# Patient Record
Sex: Female | Born: 1975 | Hispanic: No | Marital: Married
Health system: Southern US, Community
[De-identification: ages and names within clinical notes are randomized; demographics above are authoritative.]

## PROBLEM LIST (undated history)

## (undated) DIAGNOSIS — E119 Type 2 diabetes mellitus without complications: Secondary | ICD-10-CM

## (undated) DIAGNOSIS — J45909 Unspecified asthma, uncomplicated: Secondary | ICD-10-CM

## (undated) HISTORY — PX: OTHER SURGICAL HISTORY: SHX169

## (undated) HISTORY — PX: ABDOMINAL HYSTERECTOMY: SHX81

---

## 2009-06-28 ENCOUNTER — Inpatient Hospital Stay (HOSPITAL_COMMUNITY): Admission: EM | Admit: 2009-06-28 | Discharge: 2009-07-01 | Payer: Self-pay | Admitting: Internal Medicine

## 2009-06-28 ENCOUNTER — Ambulatory Visit: Payer: Self-pay | Admitting: Diagnostic Radiology

## 2009-06-28 ENCOUNTER — Encounter: Payer: Self-pay | Admitting: Emergency Medicine

## 2009-06-29 ENCOUNTER — Encounter: Payer: Self-pay | Admitting: Physician Assistant

## 2009-06-30 ENCOUNTER — Encounter: Payer: Self-pay | Admitting: Physician Assistant

## 2009-06-30 LAB — CONVERTED CEMR LAB
HCT: 23.3 %
MCHC: 30.6 g/dL
MCV: 59.2 fL
Platelets: 242 10*3/uL
RDW: 20 %
WBC: 36.7 10*3/uL

## 2009-07-01 ENCOUNTER — Encounter: Payer: Self-pay | Admitting: Physician Assistant

## 2009-07-30 ENCOUNTER — Ambulatory Visit: Payer: Self-pay | Admitting: Physician Assistant

## 2009-07-30 DIAGNOSIS — E876 Hypokalemia: Secondary | ICD-10-CM | POA: Insufficient documentation

## 2009-07-30 DIAGNOSIS — D509 Iron deficiency anemia, unspecified: Secondary | ICD-10-CM | POA: Insufficient documentation

## 2009-07-30 DIAGNOSIS — R7309 Other abnormal glucose: Secondary | ICD-10-CM | POA: Insufficient documentation

## 2009-07-30 DIAGNOSIS — J45909 Unspecified asthma, uncomplicated: Secondary | ICD-10-CM | POA: Insufficient documentation

## 2009-08-03 ENCOUNTER — Ambulatory Visit: Payer: Self-pay | Admitting: Physician Assistant

## 2009-08-04 LAB — CONVERTED CEMR LAB
ALT: 14 units/L (ref 0–35)
AST: 12 units/L (ref 0–37)
Albumin: 4.9 g/dL (ref 3.5–5.2)
Chloride: 102 meq/L (ref 96–112)
Creatinine, Ser: 0.61 mg/dL (ref 0.40–1.20)
Eosinophils Absolute: 0 10*3/uL (ref 0.0–0.7)
Lymphocytes Relative: 22 % (ref 12–46)
Lymphs Abs: 2.9 10*3/uL (ref 0.7–4.0)
MCHC: 31.7 g/dL (ref 30.0–36.0)
Monocytes Absolute: 1.7 10*3/uL — ABNORMAL HIGH (ref 0.1–1.0)
Neutro Abs: 8.6 10*3/uL — ABNORMAL HIGH (ref 1.7–7.7)
Potassium: 4 meq/L (ref 3.5–5.3)
TSH: 1.001 microintl units/mL (ref 0.350–4.500)
Total Bilirubin: 0.4 mg/dL (ref 0.3–1.2)
Total Protein: 7.7 g/dL (ref 6.0–8.3)
WBC: 13.3 10*3/uL — ABNORMAL HIGH (ref 4.0–10.5)

## 2009-08-05 ENCOUNTER — Encounter: Payer: Self-pay | Admitting: Physician Assistant

## 2009-08-05 ENCOUNTER — Ambulatory Visit (HOSPITAL_COMMUNITY): Admission: RE | Admit: 2009-08-05 | Discharge: 2009-08-05 | Payer: Self-pay | Admitting: Physician Assistant

## 2009-08-17 ENCOUNTER — Ambulatory Visit: Payer: Self-pay | Admitting: Physician Assistant

## 2009-12-03 ENCOUNTER — Emergency Department (HOSPITAL_BASED_OUTPATIENT_CLINIC_OR_DEPARTMENT_OTHER): Admission: EM | Admit: 2009-12-03 | Discharge: 2009-12-03 | Payer: Self-pay | Admitting: Emergency Medicine

## 2009-12-03 ENCOUNTER — Ambulatory Visit: Payer: Self-pay | Admitting: Radiology

## 2010-09-07 NOTE — Assessment & Plan Note (Signed)
Summary: FOLLOW UP VISIT IN 2 WEEKS WITH Carla Li FOR ASTHMA//GK   Vital Signs:  Patient profile:   35 year old female Height:      66 inches Weight:      140 pounds BMI:     22.68 Temp:     97.7 degrees F Pulse rate:   76 / minute Pulse rhythm:   regular Resp:     18 per minute BP sitting:   122 / 82  (left arm) Cuff size:   regular  Vitals Entered By: [place] Shannon (August 17, 2009 4:19 PM) CC: f/u... Is Patient Diabetic? No Pain Assessment Patient in pain? no       Does patient need assistance? Functional Status Self care Ambulation Normal   CC:  f/u....  History of Present Illness: Here for f/u. Doing much better.  Breathing better.  Found mold in house and changed rooms.  Since has been much better.  Using proventil much less.  Used nebulizer once since last visit.  Asthma History    Asthma Control Assessment:    Age range: 12+ years    Symptoms: 0-2 days/week    Nighttime Awakenings: 0-2/month    Interferes w/ normal activity: no limitations    SABA use (not for EIB): 0-2 days/week    Asthma Control Assessment: Well Controlled   Current Medications (verified): 1)  Ferrous Sulfate 325 (65 Fe) Mg Tabs (Ferrous Sulfate) .... Take 1 Tablet By Mouth Two Times A Day 2)  Potassium Gluconate 595 Mg Tabs (Potassium Gluconate) .Marland Kitchen.. 1 By Mouth Two Times A Day (Over The Counter) 3)  Ventolin Hfa 108 (90 Base) Mcg/act Aers (Albuterol Sulfate) .Marland Kitchen.. 1-2 Puffs Every 4-6 Hours As Needed 4)  Nebulizer  Misc (Nebulizers) .... Brewing technologist (Dx:493.90) 5)  Advair Diskus 250-50 Mcg/dose Aepb (Fluticasone-Salmeterol) .Marland Kitchen.. 1 Puff Two Times A Day 6)  Advair Diskus 500-50 Mcg/dose Aepb (Fluticasone-Salmeterol) .Marland Kitchen.. 1 Puff Two Times A Day 7)  Prednisone 10 Mg Tabs (Prednisone) .... Take 6 Tabs Today, 5 On Fri, 4 On Sat, 3 On Sun, 2 On Mon and 1 On Tues and Stop 8)  Albuterol Sulfate (2.5 Mg/71ml) 0.083% Nebu (Albuterol Sulfate) .... Use in Nebulizer Every 6  Hours As Needed For Severe Shortness of Breath  Allergies (verified): 1)  ! Sulfa 2)  Codeine  Physical Exam  General:  alert and well-developed.   Head:  normocephalic and atraumatic.   Lungs:  normal breath sounds, no crackles, and no wheezes.   Heart:  normal rate and regular rhythm.   Neurologic:  alert & oriented X3 and cranial nerves II-XII intact.   Psych:  normally interactive.     Impression & Recommendations:  Problem # 1:  ASTHMA (ICD-493.90) much better controlled  The following medications were removed from the medication list:    Advair Diskus 500-50 Mcg/dose Aepb (Fluticasone-salmeterol) .Marland Kitchen... 1 puff two times a day    Prednisone 10 Mg Tabs (Prednisone) .Marland Kitchen... Take 6 tabs today, 5 on fri, 4 on sat, 3 on sun, 2 on mon and 1 on tues and stop Her updated medication list for this problem includes:    Ventolin Hfa 108 (90 Base) Mcg/act Aers (Albuterol sulfate) .Marland Kitchen... 1-2 puffs every 4-6 hours as needed    Advair Diskus 250-50 Mcg/dose Aepb (Fluticasone-salmeterol) .Marland Kitchen... 1 puff two times a day    Albuterol Sulfate (2.5 Mg/13ml) 0.083% Nebu (Albuterol sulfate) ..... Use in nebulizer every 6 hours as needed for severe shortness of breath  Complete Medication List: 1)  Ferrous Sulfate 325 (65 Fe) Mg Tabs (Ferrous sulfate) .... Take 1 tablet by mouth two times a day 2)  Potassium Gluconate 595 Mg Tabs (Potassium gluconate) .Marland Kitchen.. 1 by mouth two times a day (over the counter) 3)  Ventolin Hfa 108 (90 Base) Mcg/act Aers (Albuterol sulfate) .Marland Kitchen.. 1-2 puffs every 4-6 hours as needed 4)  Nebulizer Misc (Nebulizers) .... Dispense nebulizer machine and equipment (dx:493.90) 5)  Advair Diskus 250-50 Mcg/dose Aepb (Fluticasone-salmeterol) .Marland Kitchen.. 1 puff two times a day 6)  Albuterol Sulfate (2.5 Mg/13ml) 0.083% Nebu (Albuterol sulfate) .... Use in nebulizer every 6 hours as needed for severe shortness of breath  Patient Instructions: 1)  Follow up as previously  scheduled. Prescriptions: ADVAIR DISKUS 250-50 MCG/DOSE AEPB (FLUTICASONE-SALMETEROL) 1 puff two times a day  #2 x 0   Entered and Authorized by:   Tereso Newcomer PA-C   Signed by:   Tereso Newcomer PA-C on 08/17/2009   Method used:   Samples Given   RxID:   [id number]

## 2010-09-07 NOTE — Letter (Signed)
Summary: PT INFORMATION SHEET  PT INFORMATION SHEET   Imported By: Arta Bruce 09/02/2009 15:37:33  _____________________________________________________________________  External Attachment:    Type:   Image     Comment:   External Document

## 2010-10-26 LAB — URINALYSIS, ROUTINE W REFLEX MICROSCOPIC
Bilirubin Urine: NEGATIVE
Glucose, UA: NEGATIVE mg/dL
Hgb urine dipstick: NEGATIVE
Ketones, ur: 40 mg/dL — AB
Nitrite: NEGATIVE
Protein, ur: NEGATIVE mg/dL
Specific Gravity, Urine: >1.046 — ABNORMAL HIGH (ref 1.005–1.030)
Urobilinogen, UA: 0.2 mg/dL (ref 0.0–1.0)
pH: 5 (ref 5.0–8.0)

## 2010-10-26 LAB — DIFFERENTIAL
Basophils Absolute: 0 10*3/uL (ref 0.0–0.1)
Basophils Relative: 0 % (ref 0–1)
Eosinophils Relative: 0 % (ref 0–5)
Lymphocytes Relative: 8 % — ABNORMAL LOW (ref 12–46)
Lymphs Abs: 1.3 10*3/uL (ref 0.7–4.0)
Monocytes Relative: 5 % (ref 3–12)

## 2010-10-26 LAB — BASIC METABOLIC PANEL
Calcium: 9.5 mg/dL (ref 8.4–10.5)
Chloride: 106 mEq/L (ref 96–112)
Creatinine, Ser: 0.7 mg/dL (ref 0.4–1.2)
Glucose, Bld: 111 mg/dL — ABNORMAL HIGH (ref 70–99)
Potassium: 4.2 mEq/L (ref 3.5–5.1)

## 2010-10-26 LAB — CBC
HCT: 43.8 % (ref 36.0–46.0)
Hemoglobin: 14.4 g/dL (ref 12.0–15.0)
Platelets: 217 10*3/uL (ref 150–400)
WBC: 16 10*3/uL — ABNORMAL HIGH (ref 4.0–10.5)

## 2010-10-26 LAB — WET PREP, GENITAL: Yeast Wet Prep HPF POC: NONE SEEN

## 2010-11-10 LAB — CROSSMATCH
ABO/RH(D): O NEG
Antibody Screen: NEGATIVE

## 2010-11-10 LAB — COMPREHENSIVE METABOLIC PANEL
AST: 20 U/L (ref 0–37)
Alkaline Phosphatase: 91 U/L (ref 39–117)
Calcium: 8.9 mg/dL (ref 8.4–10.5)
Chloride: 102 mEq/L (ref 96–112)
GFR calc non Af Amer: >60 mL/min (ref >60)
Potassium: 2.7 mEq/L — CL (ref 3.5–5.1)
Sodium: 141 mEq/L (ref 135–145)

## 2010-11-10 LAB — CBC
HCT: 23.3 % — ABNORMAL LOW (ref 36.0–46.0)
Hemoglobin: 8.4 g/dL — ABNORMAL LOW (ref 12.0–15.0)
MCHC: 30.6 g/dL (ref 30.0–36.0)
MCHC: 30.8 g/dL (ref 30.0–36.0)
MCV: 59.2 fL — ABNORMAL LOW (ref 78.0–100.0)
Platelets: 242 10*3/uL (ref 150–400)
RDW: 18.6 % — ABNORMAL HIGH (ref 11.5–15.5)
WBC: 36.7 10*3/uL — ABNORMAL HIGH (ref 4.0–10.5)

## 2010-11-10 LAB — FOLATE: Folate: 9.2 ng/mL

## 2010-11-10 LAB — DIFFERENTIAL
Basophils Absolute: 0.2 10*3/uL — ABNORMAL HIGH (ref 0.0–0.1)
Eosinophils Relative: 1 % (ref 0–5)
Lymphocytes Relative: 7 % — ABNORMAL LOW (ref 12–46)
Monocytes Absolute: 1.1 10*3/uL — ABNORMAL HIGH (ref 0.1–1.0)
Monocytes Relative: 6 % (ref 3–12)
Neutrophils Relative %: 85 % — ABNORMAL HIGH (ref 43–77)

## 2010-11-10 LAB — URINALYSIS, MICROSCOPIC ONLY
Bilirubin Urine: NEGATIVE
Nitrite: NEGATIVE
pH: 5 (ref 5.0–8.0)

## 2010-11-10 LAB — VITAMIN B12: Vitamin B-12: 465 pg/mL (ref 211–911)

## 2010-11-10 LAB — RETICULOCYTES: Retic Ct Pct: 0.9 % (ref 0.4–3.1)

## 2010-11-10 LAB — ABO/RH: ABO/RH(D): O NEG

## 2010-11-10 LAB — IRON AND TIBC: UIBC: 376 ug/dL

## 2010-11-10 LAB — T4, FREE: Free T4: 0.98 ng/dL (ref 0.80–1.80)

## 2014-05-14 DIAGNOSIS — J4520 Mild intermittent asthma, uncomplicated: Secondary | ICD-10-CM | POA: Insufficient documentation

## 2015-01-29 DIAGNOSIS — J309 Allergic rhinitis, unspecified: Secondary | ICD-10-CM | POA: Insufficient documentation

## 2015-01-29 DIAGNOSIS — J45909 Unspecified asthma, uncomplicated: Secondary | ICD-10-CM | POA: Insufficient documentation

## 2015-01-29 DIAGNOSIS — H6090 Unspecified otitis externa, unspecified ear: Secondary | ICD-10-CM | POA: Insufficient documentation

## 2015-01-29 DIAGNOSIS — J3089 Other allergic rhinitis: Secondary | ICD-10-CM | POA: Insufficient documentation

## 2015-03-31 DIAGNOSIS — H6093 Unspecified otitis externa, bilateral: Secondary | ICD-10-CM

## 2015-05-22 ENCOUNTER — Ambulatory Visit: Payer: Self-pay | Admitting: Internal Medicine

## 2015-11-26 DIAGNOSIS — G2581 Restless legs syndrome: Secondary | ICD-10-CM | POA: Insufficient documentation

## 2016-02-04 ENCOUNTER — Other Ambulatory Visit: Payer: Self-pay | Admitting: Allergy

## 2016-02-04 MED ORDER — EPINEPHRINE 0.3 MG/0.3ML IJ SOAJ: 0.3000 mg | Freq: Once | INTRAMUSCULAR | Status: DC

## 2016-11-01 DIAGNOSIS — N800 Endometriosis of uterus: Secondary | ICD-10-CM | POA: Insufficient documentation

## 2016-11-01 DIAGNOSIS — N8003 Adenomyosis of the uterus: Secondary | ICD-10-CM | POA: Insufficient documentation

## 2018-02-21 DIAGNOSIS — H608X3 Other otitis externa, bilateral: Secondary | ICD-10-CM | POA: Insufficient documentation

## 2018-02-21 DIAGNOSIS — H6983 Other specified disorders of Eustachian tube, bilateral: Secondary | ICD-10-CM | POA: Insufficient documentation

## 2020-06-02 ENCOUNTER — Other Ambulatory Visit: Payer: Self-pay

## 2020-06-02 ENCOUNTER — Emergency Department (HOSPITAL_BASED_OUTPATIENT_CLINIC_OR_DEPARTMENT_OTHER): Payer: 59

## 2020-06-02 ENCOUNTER — Emergency Department (HOSPITAL_BASED_OUTPATIENT_CLINIC_OR_DEPARTMENT_OTHER)
Admission: EM | Admit: 2020-06-02 | Discharge: 2020-06-02 | Disposition: A | Discharge status: Home / Self Care | Payer: 59 | Attending: Emergency Medicine | Admitting: Emergency Medicine

## 2020-06-02 ENCOUNTER — Emergency Department (HOSPITAL_BASED_OUTPATIENT_CLINIC_OR_DEPARTMENT_OTHER): Admission: EM | Admit: 2020-06-02 | Discharge: 2020-06-02 | Discharge status: Absent without leave | Payer: Self-pay

## 2020-06-02 ENCOUNTER — Encounter (HOSPITAL_BASED_OUTPATIENT_CLINIC_OR_DEPARTMENT_OTHER): Payer: Self-pay

## 2020-06-02 DIAGNOSIS — J45909 Unspecified asthma, uncomplicated: Secondary | ICD-10-CM | POA: Diagnosis not present

## 2020-06-02 DIAGNOSIS — M546 Pain in thoracic spine: Secondary | ICD-10-CM | POA: Insufficient documentation

## 2020-06-02 DIAGNOSIS — M545 Low back pain, unspecified: Secondary | ICD-10-CM | POA: Insufficient documentation

## 2020-06-02 DIAGNOSIS — Y9269 Other specified industrial and construction area as the place of occurrence of the external cause: Secondary | ICD-10-CM | POA: Diagnosis not present

## 2020-06-02 DIAGNOSIS — Z87891 Personal history of nicotine dependence: Secondary | ICD-10-CM | POA: Diagnosis not present

## 2020-06-02 DIAGNOSIS — S39012A Strain of muscle, fascia and tendon of lower back, initial encounter: Secondary | ICD-10-CM

## 2020-06-02 DIAGNOSIS — M549 Dorsalgia, unspecified: Secondary | ICD-10-CM

## 2020-06-02 DIAGNOSIS — Z880 Allergy status to penicillin: Secondary | ICD-10-CM | POA: Diagnosis not present

## 2020-06-02 DIAGNOSIS — E119 Type 2 diabetes mellitus without complications: Secondary | ICD-10-CM | POA: Insufficient documentation

## 2020-06-02 DIAGNOSIS — X500XXA Overexertion from strenuous movement or load, initial encounter: Secondary | ICD-10-CM | POA: Diagnosis not present

## 2020-06-02 HISTORY — DX: Type 2 diabetes mellitus without complications: E11.9

## 2020-06-02 HISTORY — DX: Unspecified asthma, uncomplicated: J45.909

## 2020-06-02 LAB — URINALYSIS, ROUTINE W REFLEX MICROSCOPIC
Bilirubin Urine: NEGATIVE
Glucose, UA: NEGATIVE mg/dL
Hgb urine dipstick: NEGATIVE
Ketones, ur: NEGATIVE mg/dL
Leukocytes,Ua: NEGATIVE
Nitrite: NEGATIVE
Protein, ur: NEGATIVE mg/dL
Specific Gravity, Urine: >1.03 — ABNORMAL HIGH (ref 1.005–1.030)
pH: 5 (ref 5.0–8.0)

## 2020-06-02 LAB — COMPREHENSIVE METABOLIC PANEL
ALT: 17 U/L (ref 0–44)
AST: 22 U/L (ref 15–41)
Albumin: 4.6 g/dL (ref 3.5–5.0)
Alkaline Phosphatase: 52 U/L (ref 38–126)
Anion gap: 11 (ref 5–15)
BUN: 14 mg/dL (ref 6–20)
CO2: 23 mmol/L (ref 22–32)
Calcium: 8.7 mg/dL — ABNORMAL LOW (ref 8.9–10.3)
Chloride: 101 mmol/L (ref 98–111)
Creatinine, Ser: 0.6 mg/dL (ref 0.44–1.00)
GFR, Estimated: >60 mL/min (ref >60)
Glucose, Bld: 103 mg/dL — ABNORMAL HIGH (ref 70–99)
Potassium: 3.7 mmol/L (ref 3.5–5.1)
Sodium: 135 mmol/L (ref 135–145)
Total Bilirubin: 0.7 mg/dL (ref 0.3–1.2)
Total Protein: 8.1 g/dL (ref 6.5–8.1)

## 2020-06-02 LAB — CBC WITH DIFFERENTIAL/PLATELET
Abs Immature Granulocytes: 0.08 10*3/uL — ABNORMAL HIGH (ref 0.00–0.07)
Basophils Absolute: 0.1 10*3/uL (ref 0.0–0.1)
Basophils Relative: 1 %
Eosinophils Absolute: 0.3 10*3/uL (ref 0.0–0.5)
Eosinophils Relative: 2 %
HCT: 39 % (ref 36.0–46.0)
Hemoglobin: 12.6 g/dL (ref 12.0–15.0)
Immature Granulocytes: 1 %
Lymphocytes Relative: 9 %
Lymphs Abs: 1.5 10*3/uL (ref 0.7–4.0)
MCH: 27 pg (ref 26.0–34.0)
MCHC: 32.3 g/dL (ref 30.0–36.0)
MCV: 83.5 fL (ref 80.0–100.0)
Monocytes Absolute: 1.2 10*3/uL — ABNORMAL HIGH (ref 0.1–1.0)
Monocytes Relative: 7 %
Neutro Abs: 13.7 10*3/uL — ABNORMAL HIGH (ref 1.7–7.7)
Neutrophils Relative %: 80 %
Platelets: 247 10*3/uL (ref 150–400)
RBC: 4.67 MIL/uL (ref 3.87–5.11)
RDW: 13.9 % (ref 11.5–15.5)
WBC: 16.8 10*3/uL — ABNORMAL HIGH (ref 4.0–10.5)
nRBC: 0 % (ref 0.0–0.2)

## 2020-06-02 LAB — PREGNANCY, URINE: Preg Test, Ur: NEGATIVE

## 2020-06-02 MED ORDER — ONDANSETRON HCL 4 MG/2ML IJ SOLN
4.0000 mg | Freq: Once | INTRAMUSCULAR | Status: AC
  Administered 2020-06-02: 4 mg via INTRAVENOUS
  Filled 2020-06-02: qty 2

## 2020-06-02 MED ORDER — KETOROLAC TROMETHAMINE 30 MG/ML IJ SOLN
30.0000 mg | Freq: Once | INTRAMUSCULAR | Status: AC
  Administered 2020-06-02: 30 mg via INTRAVENOUS
  Filled 2020-06-02: qty 1

## 2020-06-02 MED ORDER — DIAZEPAM 5 MG PO TABS: 5.0000 mg | ORAL_TABLET | Freq: Four times a day (QID) | ORAL | 0 refills | Status: DC | PRN

## 2020-06-02 MED ORDER — SODIUM CHLORIDE 0.9 % IV BOLUS
1000.0000 mL | Freq: Once | INTRAVENOUS | Status: AC
  Administered 2020-06-02: 1000 mL via INTRAVENOUS

## 2020-06-02 MED ORDER — MORPHINE SULFATE (PF) 4 MG/ML IV SOLN
4.0000 mg | Freq: Once | INTRAVENOUS | Status: AC
  Administered 2020-06-02: 4 mg via INTRAVENOUS
  Filled 2020-06-02: qty 1

## 2020-06-02 MED ORDER — DIAZEPAM 5 MG/ML IJ SOLN
5.0000 mg | Freq: Once | INTRAMUSCULAR | Status: AC
Start: 2020-06-02 — End: 2020-06-02
  Administered 2020-06-02: 5 mg via INTRAVENOUS
  Filled 2020-06-02: qty 2

## 2020-06-02 MED ORDER — LIDOCAINE 5 % EX PTCH: 1.0000 | MEDICATED_PATCH | CUTANEOUS | 0 refills | Status: DC

## 2020-06-02 NOTE — ED Notes (Signed)
AVS and Rx reviewed with pt and husband. Discussed safety while utilizing valium for back pain. Opportunity for questions provided, copy of avs and work note given to husband.

## 2020-06-02 NOTE — ED Notes (Signed)
PT IS HIGH FALL RISK DUE TO MEDICATIONS REC

## 2020-06-02 NOTE — ED Notes (Signed)
ED MD at bedside to speak with pt and update on current status and plan of care

## 2020-06-02 NOTE — ED Provider Notes (Signed)
[facility] EMERGENCY DEPARTMENT Provider Note   CSN: [id number] Arrival date & time: 06/02/20  1600     History Chief Complaint  Patient presents with  . Back Pain    Carla Li is a 44 y.o. female hx of DM, asthma, here presenting with back pain.  Patient states that about 3 months ago she was picking up something heavy at work and she had upper mid back pain.  Patient states that it is a sense of spasms.  She states that her pain has been managed at home with some Tylenol and ibuprofen.  She states that this morning she took some Tylenol and this afternoon the pain suddenly got worse.  States that the pain is in the right flank area has no radiation to the pain.  Patient denies any additional trauma or injury.  Denies any trouble urinating or blood in her urine.  The history is provided by the patient.       Past Medical History:  Diagnosis Date  . Asthma   . Diabetes mellitus without complication [facility])     Patient Active Problem List   Diagnosis Date Noted  . HYPOKALEMIA 07/30/2009  . ANEMIA-IRON DEFICIENCY 07/30/2009  . ASTHMA 07/30/2009  . DIABETES MELLITUS, BORDERLINE 07/30/2009    Past Surgical History:  Procedure Laterality Date  . ABDOMINAL HYSTERECTOMY       OB History   No obstetric history on file.     No family history on file.  Social History   Tobacco Use  . Smoking status: Former Games developer  . Smokeless tobacco: Never Used  Vaping Use  . Vaping Use: Never used  Substance Use Topics  . Alcohol use: Never  . Drug use: Never    Home Medications Prior to Admission medications   Not on File    Allergies    Bactrim [sulfamethoxazole-trimethoprim], Codeine, Penicillins, Pineapple, and Sulfonamide derivatives  Review of Systems   Review of Systems  Musculoskeletal: Positive for back pain.  All other systems reviewed and are negative.   Physical Exam Updated Vital Signs BP 111/70 (BP Location: Left Arm)   Pulse 70   Temp 98  F (36.7 C) (Oral)   Resp 18   Ht 5\' 8"  (1.727 m)   Wt 71.2 kg   SpO2 100%   BMI 23.87 kg/m   Physical Exam Vitals and nursing note reviewed.  Constitutional:      Appearance: Normal appearance.  HENT:     Head: Normocephalic.     Nose: Nose normal.     Mouth/Throat:     Mouth: Mucous membranes are moist.  Eyes:     Extraocular Movements: Extraocular movements intact.     Pupils: Pupils are equal, round, and reactive to light.  Cardiovascular:     Rate and Rhythm: Normal rate and regular rhythm.     Pulses: Normal pulses.     Heart sounds: Normal heart sounds.  Pulmonary:     Effort: Pulmonary effort is normal.     Breath sounds: Normal breath sounds.  Abdominal:     General: Abdomen is flat.     Palpations: Abdomen is soft.  Musculoskeletal:     Cervical back: Normal range of motion.     Comments: Mild right para thoracic and paralumbar versus CVA tenderness.  No obvious deformity.  Skin:    General: Skin is warm.     Capillary Refill: Capillary refill takes less than 2 seconds.  Neurological:     General: No  focal deficit present.     Mental Status: She is alert and oriented to person, place, and time.     Comments: No saddle anesthesia and patient is neurovascular intact in lower extremities.  Psychiatric:        Mood and Affect: Mood normal.        Behavior: Behavior normal.     ED Results / Procedures / Treatments   Labs (all labs ordered are listed, but only abnormal results are displayed) Labs Reviewed  CBC WITH DIFFERENTIAL/PLATELET - Abnormal; Notable for the following components:      Result Value   WBC 16.8 (*)    Neutro Abs 13.7 (*)    Monocytes Absolute 1.2 (*)    Abs Immature Granulocytes 0.08 (*)    All other components within normal limits  COMPREHENSIVE METABOLIC PANEL - Abnormal; Notable for the following components:   Glucose, Bld 103 (*)    Calcium 8.7 (*)    All other components within normal limits  URINALYSIS, ROUTINE W REFLEX  MICROSCOPIC - Abnormal; Notable for the following components:   Specific Gravity, Urine >1.030 (*)    All other components within normal limits  PREGNANCY, URINE    EKG None  Radiology DG Thoracic Spine W/Swimmers  Result Date: 06/02/2020 CLINICAL DATA:  Back pain EXAM: THORACIC SPINE - 3 VIEWS COMPARISON:  None. FINDINGS: There is no evidence of thoracic spine fracture. Alignment is normal. No other significant bone abnormalities are identified. IMPRESSION: Negative. Electronically Signed   By: Jasmine Pang M.D.   On: 06/02/2020 17:50   DG Lumbar Spine Complete  Result Date: 06/02/2020 CLINICAL DATA:  Back pain EXAM: LUMBAR SPINE - COMPLETE 4+ VIEW COMPARISON:  None. FINDINGS: Mild dextrocurvature of the lumbar spine. Sagittal alignment within normal limits. Vertebral body heights and disc spaces are within normal limits. IMPRESSION: Mild dextrocurvature of the lumbar spine.  Otherwise negative. Electronically Signed   By: Jasmine Pang M.D.   On: 06/02/2020 17:51   DG Abdomen 1 View  Result Date: 06/02/2020 CLINICAL DATA:  Right flank pain EXAM: ABDOMEN - 1 VIEW COMPARISON:  CT 12/03/2009 FINDINGS: Faint calcifications in the right upper quadrant consistent with gallstones. Nonobstructed gas pattern with moderate stool. No radiopaque calculi over the kidneys. Probable phleboliths in the left pelvis. IMPRESSION: 1. Cholelithiasis. 2. Nonobstructed gas pattern with moderate stool. Electronically Signed   By: Jasmine Pang M.D.   On: 06/02/2020 17:52    Procedures Procedures (including critical care time)  Medications Ordered in ED Medications  sodium chloride 0.9 % bolus 1,000 mL (1,000 mLs Intravenous New Bag/Given 06/02/20 1742)  morphine 4 MG/ML injection 4 mg (4 mg Intravenous Given 06/02/20 1649)  ondansetron (ZOFRAN) injection 4 mg (4 mg Intravenous Given 06/02/20 1648)  ketorolac (TORADOL) 30 MG/ML injection 30 mg (30 mg Intravenous Given 06/02/20 1757)  diazepam (VALIUM)  injection 5 mg (5 mg Intravenous Given 06/02/20 1758)    ED Course  I have reviewed the triage vital signs and the nursing notes.  Pertinent labs & imaging results that were available during my care of the patient were reviewed by me and considered in my medical decision making (see chart for details).    MDM Rules/Calculators/A&P                          Daizee Firmin is a 44 y.o. female here presenting with right flank pain.  Patient likely has lumbar strain versus renal colic.  Plan  to get x-rays of the spine and KUB.  We will also get a UA to look for hematuria.  Patient has no neuro deficits to warrant an MRI.  Will give pain meds and reassess.  6:29 PM WBC 16. But UA showed no UTI and no fevers. xrays unremarkable. Pain improved with NSAIDs, pain meds. Likely muscle spasms. Will dc home with motrin, valium, lidocaine patch   Final Clinical Impression(s) / ED Diagnoses Final diagnoses:  Back pain    Rx / DC Orders ED Discharge Orders    None       Charlynne Pander, MD 06/02/20 [phone]

## 2020-06-02 NOTE — ED Notes (Signed)
PTambulated to BR. Right upper back pain. Denies n/t or urinary sx.

## 2020-06-02 NOTE — ED Triage Notes (Addendum)
Pt c/o mid/upper back pain started 130pm-pain worse with movement-reports injury to back ~3 months ago-slow gait-stood in triage

## 2020-06-02 NOTE — Discharge Instructions (Signed)
Your x-rays did not show any fracture.  You likely have muscle strain.  See your doctor for follow-up.  Rest for 2 days.  Continue taking ibuprofen.   You may add Valium for spasms.   You can also use lidocaine patch for comfort  See your doctor for follow-up  Return to ER if you have worse back pain, flank pain, weakness, numbness.

## 2020-06-02 NOTE — ED Notes (Signed)
Pt informed, along with husband, what medications are being given, pt instructed to not get up without staff in the room, sr x 2 up, husband at bedside, call bell within reach.

## 2020-07-13 ENCOUNTER — Ambulatory Visit: Payer: 59 | Admitting: Physician Assistant

## 2020-07-16 ENCOUNTER — Encounter: Payer: Self-pay | Admitting: Physician Assistant

## 2020-07-16 ENCOUNTER — Ambulatory Visit: Payer: 59 | Admitting: Physician Assistant

## 2020-07-16 ENCOUNTER — Other Ambulatory Visit: Payer: Self-pay | Admitting: Physician Assistant

## 2020-07-16 DIAGNOSIS — Z0289 Encounter for other administrative examinations: Secondary | ICD-10-CM

## 2020-07-16 DIAGNOSIS — H9193 Unspecified hearing loss, bilateral: Secondary | ICD-10-CM | POA: Insufficient documentation

## 2020-11-30 ENCOUNTER — Ambulatory Visit: Payer: Self-pay | Admitting: Family Medicine

## 2020-12-08 ENCOUNTER — Other Ambulatory Visit: Payer: Self-pay

## 2020-12-08 ENCOUNTER — Ambulatory Visit: Payer: 59 | Admitting: Family Medicine

## 2020-12-08 ENCOUNTER — Encounter: Payer: Self-pay | Admitting: Family Medicine

## 2020-12-08 VITALS — BP 126/85 | HR 89 | Temp 98.4°F | Ht 68.0 in | Wt 166.8 lb

## 2020-12-08 DIAGNOSIS — H608X3 Other otitis externa, bilateral: Secondary | ICD-10-CM | POA: Diagnosis not present

## 2020-12-08 DIAGNOSIS — G2581 Restless legs syndrome: Secondary | ICD-10-CM | POA: Diagnosis not present

## 2020-12-08 DIAGNOSIS — J4530 Mild persistent asthma, uncomplicated: Secondary | ICD-10-CM

## 2020-12-08 DIAGNOSIS — K581 Irritable bowel syndrome with constipation: Secondary | ICD-10-CM | POA: Diagnosis not present

## 2020-12-08 MED ORDER — FLUTICASONE FUROATE-VILANTEROL 200-25 MCG/INH IN AEPB: 1.0000 | INHALATION_SPRAY | Freq: Every day | RESPIRATORY_TRACT | 11 refills | Status: DC

## 2020-12-08 MED ORDER — MONTELUKAST SODIUM 10 MG PO TABS: 10.0000 mg | ORAL_TABLET | Freq: Every day | ORAL | 1 refills | Status: DC

## 2020-12-08 MED ORDER — ROPINIROLE HCL 2 MG PO TABS: 1.0000 mg | ORAL_TABLET | Freq: Every day | ORAL | 1 refills | Status: DC

## 2020-12-08 MED ORDER — TIZANIDINE HCL 6 MG PO CAPS: 6.0000 mg | ORAL_CAPSULE | Freq: Three times a day (TID) | ORAL | 1 refills | Status: DC | PRN

## 2020-12-08 MED ORDER — ALBUTEROL SULFATE HFA 108 (90 BASE) MCG/ACT IN AERS: 2.0000 | INHALATION_SPRAY | RESPIRATORY_TRACT | 11 refills | Status: DC | PRN

## 2020-12-08 MED ORDER — LINACLOTIDE 145 MCG PO CAPS: 145.0000 ug | ORAL_CAPSULE | Freq: Every day | ORAL | 5 refills | Status: DC

## 2020-12-08 MED ORDER — MELOXICAM 15 MG PO TABS: 15.0000 mg | ORAL_TABLET | Freq: Every day | ORAL | 5 refills | Status: DC

## 2020-12-08 NOTE — Progress Notes (Signed)
Subjective:  Patient ID: Carla Li, female    DOB: [birth year]  Age: 45 y.o. MRN: [id number]  CC: Establish Care    HPI Carla Li presents for nausea and irregular BMs. Sometimes 3-4 BM Carla day. Other times goes several days, 5-6 days. Then wil have explosive BM. Onset  100mo ago. Used to have it before her hyst.  In 2019. Worse now than ever.   Asthma has worse since she had COVID in January, 4 months ago.  She says she has Carla tightness in the upper chest that was not there before.  Of note is that she has not taken Breo in quite some time either.  She uses her albuterol as needed.  She also has Singulair to take daily.  She has Carla long history of allergy as well.  She uses ropinirole for restless leg 0.5 mg 2 at bedtime.  This is insufficient.  As Carla result she has used Valium in the past.  However she has not had any Valium in the last for 5 months.  Additionally she has used Valium as Carla muscle relaxer for her back.  She has chronic back repetitive call for restless leg 0.5 mg 2 at bedtime.  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  She had to bend over forward for several hours on her own.  As result she has had flares with several activities over time.  She says the Valium does work for her.  Depression screen PHQ 2/9 12/08/2020  Decreased Interest 0  Down, Depressed, Hopeless 0  PHQ - 2 Score 0    History ABrittleyhas Carla past medical history of Asthma and Diabetes mellitus without complication [place]   She has Carla past surgical history that includes Abdominal hysterectomy and tube placed and removed in ears.   Her family history includes Asthma in her sister; Celiac disease in her mother; Diabetes in her sister; Drug abuse in her brother; Eczema in her sister; Hearing loss in her sister; Heart murmur in her daughter; Hypotension in her mother; Seizures in her brother.She reports that she has quit smoking.  She has never used smokeless tobacco. She reports previous alcohol use. She reports that she does not use drugs.    ROS Review of Systems  Constitutional: Negative.   HENT: Negative for congestion.   Eyes: Negative for visual disturbance.  Respiratory: Negative for shortness of breath.   Cardiovascular: Negative for chest pain.  Gastrointestinal: Positive for abdominal pain and constipation. Negative for nausea and vomiting.  Genitourinary: Negative for difficulty urinating.  Musculoskeletal: Negative for arthralgias and myalgias.  Skin: Positive for rash (eczema cheeks and ears).  Neurological: Negative for headaches.  Psychiatric/Behavioral: Negative for sleep disturbance.    Objective:  BP 126/85   Pulse 89   Temp 98.4 F (36.9 C)   Ht 5' 8"  (1.727 m)   Wt 166 lb 12.8 oz (75.7 kg)   SpO2 98%   BMI 25.36 kg/m   BP Readings from Last 3 Encounters:  12/08/20 126/85  06/02/20 (!) 105/57  01/30/15 110/76    Wt Readings from Last 3 Encounters:  12/08/20 166 lb 12.8 oz (75.7 kg)  06/02/20 157 lb (71.2 kg)     Physical Exam Constitutional:      General: She is not in acute distress.    Appearance: She is well-developed.  HENT:  Head: Normocephalic and atraumatic.  Eyes:     Conjunctiva/sclera: Conjunctivae normal.     Pupils: Pupils are equal, round, and reactive to light.  Neck:     Thyroid: No thyromegaly.  Cardiovascular:     Rate and Rhythm: Normal rate and regular rhythm.     Heart sounds: Normal heart sounds. No murmur heard.   Pulmonary:     Effort: Pulmonary effort is normal. No respiratory distress.     Breath sounds: Normal breath sounds. No wheezing or rales.  Abdominal:     General: Bowel sounds are normal. There is no distension.     Palpations: Abdomen is soft.     Tenderness: There is no abdominal tenderness.  Musculoskeletal:        General: Normal range of motion.     Cervical back: Normal range of motion and neck supple.   Lymphadenopathy:     Cervical: No cervical adenopathy.  Skin:    General: Skin is warm and dry.  Neurological:     Mental Status: She is alert and oriented to person, place, and time.  Psychiatric:        Behavior: Behavior normal.        Thought Content: Thought content normal.        Judgment: Judgment normal.       Assessment & Plan:   Carla Li was seen today for establish care.  Diagnoses and all orders for this visit:  Mild persistent asthma without complication -     CBC with Differential/Platelet -     CMP14+EGFR  Chronic eczematous otitis externa of both ears -     CBC with Differential/Platelet -     CMP14+EGFR  Restless legs syndrome -     CBC with Differential/Platelet -     CMP14+EGFR  Irritable bowel syndrome with constipation -     CBC with Differential/Platelet -     CMP14+EGFR  Other orders -     tizanidine (ZANAFLEX) 6 MG capsule; Take 1 capsule (6 mg total) by mouth 3 (three) times daily as needed for muscle spasms. -     meloxicam (MOBIC) 15 MG tablet; Take 1 tablet (15 mg total) by mouth daily. For joint and muscle pain -     rOPINIRole (REQUIP) 2 MG tablet; Take 0.5 tablets (1 mg total) by mouth at bedtime. -     fluticasone furoate-vilanterol (BREO ELLIPTA) 200-25 MCG/INH AEPB; Inhale 1 puff into the lungs at bedtime. -     albuterol (VENTOLIN HFA) 108 (90 Base) MCG/ACT inhaler; Inhale 2 puffs into the lungs every 4 (four) hours as needed for wheezing or shortness of breath. TWO PUFFS EVERY 4-6 HOURS FOR COUGH AND WHEEZE -     linaclotide (LINZESS) 145 MCG CAPS capsule; Take 1 capsule (145 mcg total) by mouth daily. To regulate bowel movements -     montelukast (SINGULAIR) 10 MG tablet; Take 1 tablet (10 mg total) by mouth daily.       I have discontinued [name]'s diazepam, lidocaine, albuterol, loratadine, and norethindrone-ethinyl estradiol. I have also changed her rOPINIRole, albuterol, and montelukast. Additionally, I am having her  start on tizanidine, meloxicam, and linaclotide. Lastly, I am having her maintain her ELDERBERRY PO, Multiple Vitamins-Minerals (WOMENS MULTIVITAMIN PO), fluticasone furoate-vilanterol, and EPINEPHrine.  Allergies as of 12/08/2020      Reactions   Benadryl [diphenhydramine] Nausea Only   Sulfa Antibiotics Itching, Swelling, Anaphylaxis, Hives   Bactrim [sulfamethoxazole-trimethoprim]    Codeine    REACTION:  nausea   Codeine Nausea Only   Penicillins    Pineapple    Sulfonamide Derivatives    REACTION: hives, itching   Wound Dressing Adhesive Rash   Paper tape causes rash-clear tape okay      Medication List       Accurate as of Dec 08, 2020  9:55 PM. If you have any questions, ask your nurse or doctor.        STOP taking these medications   diazepam 5 MG tablet Commonly known as: Valium Stopped by: Claretta Fraise, MD   lidocaine 5 % Commonly known as: Lidoderm Stopped by: Claretta Fraise, MD   loratadine 10 MG tablet Commonly known as: CLARITIN Stopped by: Claretta Fraise, MD   norethindrone-ethinyl estradiol 1-20 MG-MCG tablet Commonly known as: LOESTRIN Stopped by: Claretta Fraise, MD     TAKE these medications   albuterol 108 (90 Base) MCG/ACT inhaler Commonly known as: VENTOLIN HFA Inhale 2 puffs into the lungs every 4 (four) hours as needed for wheezing or shortness of breath. TWO PUFFS EVERY 4-6 HOURS FOR COUGH AND WHEEZE What changed:   when to take this  reasons to take this  Another medication with the same name was removed. Continue taking this medication, and follow the directions you see here. Changed by: Claretta Fraise, MD   ELDERBERRY PO Take by mouth.   EPINEPHrine 0.3 mg/0.3 mL Soaj injection Commonly known as: EpiPen 2-Pak Inject 0.3 mLs (0.3 mg total) into the muscle once. FOR SEVERE ALLERGIC REACTIONS   fluticasone furoate-vilanterol 200-25 MCG/INH Aepb Commonly known as: BREO ELLIPTA Inhale 1 puff into the lungs at bedtime.   linaclotide 145  MCG Caps capsule Commonly known as: Linzess Take 1 capsule (145 mcg total) by mouth daily. To regulate bowel movements Started by: Claretta Fraise, MD   meloxicam 15 MG tablet Commonly known as: MOBIC Take 1 tablet (15 mg total) by mouth daily. For joint and muscle pain Started by: Claretta Fraise, MD   montelukast 10 MG tablet Commonly known as: SINGULAIR Take 1 tablet (10 mg total) by mouth daily.   rOPINIRole 2 MG tablet Commonly known as: REQUIP Take 0.5 tablets (1 mg total) by mouth at bedtime. What changed: medication strength Changed by: Claretta Fraise, MD   tizanidine 6 MG capsule Commonly known as: ZANAFLEX Take 1 capsule (6 mg total) by mouth 3 (three) times daily as needed for muscle spasms. Started by: Claretta Fraise, MD   WOMENS MULTIVITAMIN PO Take by mouth.        Follow-up: Return in about 6 weeks (around 01/19/2021).  Claretta Fraise, M.D.

## 2020-12-09 LAB — CBC WITH DIFFERENTIAL/PLATELET
Basophils Absolute: 0.1 10*3/uL (ref 0.0–0.2)
Basos: 1 %
EOS (ABSOLUTE): 0.2 10*3/uL (ref 0.0–0.4)
Eos: 3 %
Hematocrit: 36.1 % (ref 34.0–46.6)
Hemoglobin: 11.9 g/dL (ref 11.1–15.9)
Immature Grans (Abs): 0 10*3/uL (ref 0.0–0.1)
Immature Granulocytes: 0 %
Lymphocytes Absolute: 2.6 10*3/uL (ref 0.7–3.1)
Lymphs: 34 %
MCH: 26.2 pg — ABNORMAL LOW (ref 26.6–33.0)
MCHC: 33 g/dL (ref 31.5–35.7)
MCV: 80 fL (ref 79–97)
Monocytes Absolute: 1.2 10*3/uL — ABNORMAL HIGH (ref 0.1–0.9)
Monocytes: 15 %
Neutrophils Absolute: 3.7 10*3/uL (ref 1.4–7.0)
Neutrophils: 47 %
Platelets: 247 10*3/uL (ref 150–450)
RBC: 4.54 x10E6/uL (ref 3.77–5.28)
RDW: 14 % (ref 11.7–15.4)
WBC: 7.8 10*3/uL (ref 3.4–10.8)

## 2020-12-09 LAB — CMP14+EGFR
ALT: 16 IU/L (ref 0–32)
AST: 15 IU/L (ref 0–40)
Albumin/Globulin Ratio: 1.3 (ref 1.2–2.2)
Albumin: 4.3 g/dL (ref 3.8–4.8)
Alkaline Phosphatase: 76 IU/L (ref 44–121)
BUN/Creatinine Ratio: 10 (ref 9–23)
BUN: 10 mg/dL (ref 6–24)
Bilirubin Total: 0.3 mg/dL (ref 0.0–1.2)
CO2: 24 mmol/L (ref 20–29)
Calcium: 9.2 mg/dL (ref 8.7–10.2)
Chloride: 102 mmol/L (ref 96–106)
Creatinine, Ser: 1.05 mg/dL — ABNORMAL HIGH (ref 0.57–1.00)
Globulin, Total: 3.2 g/dL (ref 1.5–4.5)
Glucose: 104 mg/dL — ABNORMAL HIGH (ref 65–99)
Potassium: 4 mmol/L (ref 3.5–5.2)
Sodium: 140 mmol/L (ref 134–144)
Total Protein: 7.5 g/dL (ref 6.0–8.5)
eGFR: 68 mL/min/{1.73_m2} (ref >59)

## 2020-12-13 NOTE — Progress Notes (Signed)
Hello Glori,  Your lab result is normal and/or stable.Some minor variations that are not significant are commonly marked abnormal, but do not represent any medical problem for you.  Best regards, Pegeen Stiger, M.D.

## 2020-12-15 ENCOUNTER — Other Ambulatory Visit: Payer: Self-pay | Admitting: *Deleted

## 2020-12-15 MED ORDER — VENTOLIN HFA 108 (90 BASE) MCG/ACT IN AERS: 2.0000 | INHALATION_SPRAY | RESPIRATORY_TRACT | 6 refills | Status: DC | PRN

## 2021-01-20 ENCOUNTER — Ambulatory Visit: Payer: 59 | Admitting: Family Medicine

## 2021-01-20 ENCOUNTER — Other Ambulatory Visit: Payer: Self-pay

## 2021-01-20 VITALS — BP 122/77 | HR 88 | Temp 98.2°F | Ht 68.0 in | Wt 172.4 lb

## 2021-01-20 DIAGNOSIS — J301 Allergic rhinitis due to pollen: Secondary | ICD-10-CM

## 2021-01-20 DIAGNOSIS — J4520 Mild intermittent asthma, uncomplicated: Secondary | ICD-10-CM

## 2021-01-20 DIAGNOSIS — G2581 Restless legs syndrome: Secondary | ICD-10-CM | POA: Diagnosis not present

## 2021-01-20 MED ORDER — DICLOFENAC SODIUM 75 MG PO TBEC: DELAYED_RELEASE_TABLET | ORAL | 1 refills | Status: DC

## 2021-01-20 MED ORDER — CETIRIZINE HCL 10 MG PO TABS: 10.0000 mg | ORAL_TABLET | Freq: Every day | ORAL | 3 refills | Status: DC

## 2021-01-20 MED ORDER — CARBIDOPA-LEVODOPA 10-100 MG PO TABS: ORAL_TABLET | ORAL | 3 refills | Status: DC

## 2021-01-20 NOTE — Progress Notes (Signed)
Subjective:  Patient ID: Carla Li, female    DOB: 09-25-1975  Age: 45 y.o. MRN: [id number]  CC: Follow-up   HPI Carla Li presents for patient has allergic rhinitis symptoms including sneezing frequently sniffling, clear rhinorrhea, watery and itchy eyes. There has been no fever no chills no sweats. No earaches. There is some scratchy throat but no sore throat or difficulty swallowing. There is some nasal congestion. She was unable to tolerate the Zyrtec due to palpitations.  She is using Breo at night.  She has been using NSAIDs for pain on the bottom of her feet.  Meloxicam has not given her adequate relief.  The increase in the dose of ropinirole has helped with the restless legs.  There has not been complete relief. Depression screen [facility] 2/9 01/20/2021 12/08/2020  Decreased Interest 0 0  Down, Depressed, Hopeless 0 0  PHQ - 2 Score 0 0  Altered sleeping 3 -  Tired, decreased energy 3 -  Change in appetite 3 -  Feeling bad or failure about yourself  0 -  Trouble concentrating 2 -  Moving slowly or fidgety/restless 0 -  Suicidal thoughts 0 -  PHQ-9 Score 11 -    History Carla Li has a past medical history of Asthma and Diabetes mellitus without complication (HCC).   She has a past surgical history that includes Abdominal hysterectomy and tube placed and removed in ears.   Her family history includes Asthma in her sister; Celiac disease in her mother; Diabetes in her sister; Drug abuse in her brother; Eczema in her sister; Hearing loss in her sister; Heart murmur in her daughter; Hypotension in her mother; Seizures in her brother.She reports that she has quit smoking. She has never used smokeless tobacco. She reports previous alcohol use. She reports that she does not use drugs.    ROS Review of Systems  Constitutional: Negative.   HENT: Negative.    Eyes:  Negative for visual disturbance.  Respiratory:  Negative for shortness of breath.   Cardiovascular:  Negative for  chest pain.  Gastrointestinal:  Negative for abdominal pain.  Musculoskeletal:  Negative for arthralgias.   Objective:  BP 122/77   Pulse 88   Temp 98.2 F (36.8 C)   Ht 5\' 8"  (1.727 m)   Wt 172 lb 6.4 oz (78.2 kg)   SpO2 99%   BMI 26.21 kg/m   BP Readings from Last 3 Encounters:  01/20/21 122/77  12/08/20 126/85  06/02/20 (!) 105/57    Wt Readings from Last 3 Encounters:  01/20/21 172 lb 6.4 oz (78.2 kg)  12/08/20 166 lb 12.8 oz (75.7 kg)  06/02/20 157 lb (71.2 kg)     Physical Exam Constitutional:      General: She is not in acute distress.    Appearance: She is well-developed.  HENT:     Head: Normocephalic and atraumatic.  Eyes:     Conjunctiva/sclera: Conjunctivae normal.     Pupils: Pupils are equal, round, and reactive to light.  Neck:     Thyroid: No thyromegaly.  Cardiovascular:     Rate and Rhythm: Normal rate and regular rhythm.     Heart sounds: Normal heart sounds. No murmur heard. Pulmonary:     Effort: Pulmonary effort is normal. No respiratory distress.     Breath sounds: Normal breath sounds. No wheezing or rales.  Abdominal:     General: Bowel sounds are normal. There is no distension.     Palpations: Abdomen is soft.  Tenderness: There is no abdominal tenderness.  Musculoskeletal:        General: Normal range of motion.     Cervical back: Normal range of motion and neck supple.  Lymphadenopathy:     Cervical: No cervical adenopathy.  Skin:    General: Skin is warm and dry.  Neurological:     Mental Status: She is alert and oriented to person, place, and time.  Psychiatric:        Behavior: Behavior normal.        Thought Content: Thought content normal.        Judgment: Judgment normal.      Assessment & Plan:   Carla Li was seen today for follow-up.  Diagnoses and all orders for this visit:  Restless legs syndrome  Mild intermittent asthma without complication  Seasonal allergic rhinitis due to pollen  Other orders -      cetirizine (ZYRTEC) 10 MG tablet; Take 1 tablet (10 mg total) by mouth daily. For allergy symptoms -     diclofenac (VOLTAREN) 75 MG EC tablet; One each evening as needed for foot pain -     carbidopa-levodopa (SINEMET) 10-100 MG tablet; Take one daily after work for restless legs      I am having Carla Li start on cetirizine, diclofenac, and carbidopa-levodopa. I am also having her maintain her ELDERBERRY PO, Multiple Vitamins-Minerals (WOMENS MULTIVITAMIN PO), tizanidine, meloxicam, rOPINIRole, fluticasone furoate-vilanterol, albuterol, linaclotide, montelukast, Ventolin HFA, and EPINEPHrine.  Allergies as of 01/20/2021       Reactions   Benadryl [diphenhydramine] Nausea Only   Sulfa Antibiotics Itching, Swelling, Anaphylaxis, Hives   Bactrim [sulfamethoxazole-trimethoprim]    Codeine    REACTION: nausea   Codeine Nausea Only   Penicillins    Pineapple    Sulfonamide Derivatives    REACTION: hives, itching   Wound Dressing Adhesive Rash   Paper tape causes rash-clear tape okay        Medication List        Accurate as of January 20, 2021 11:59 PM. If you have any questions, ask your nurse or doctor.          albuterol 108 (90 Base) MCG/ACT inhaler Commonly known as: VENTOLIN HFA Inhale 2 puffs into the lungs every 4 (four) hours as needed for wheezing or shortness of breath. TWO PUFFS EVERY 4-6 HOURS FOR COUGH AND WHEEZE   Ventolin HFA 108 (90 Base) MCG/ACT inhaler Generic drug: albuterol Inhale 2 puffs into the lungs every 4 (four) hours as needed for wheezing or shortness of breath.   carbidopa-levodopa 10-100 MG tablet Commonly known as: Sinemet Take one daily after work for restless legs Started by: Mechele Claude, MD   cetirizine 10 MG tablet Commonly known as: ZYRTEC Take 1 tablet (10 mg total) by mouth daily. For allergy symptoms Started by: Mechele Claude, MD   diclofenac 75 MG EC tablet Commonly known as: VOLTAREN One each evening as needed for  foot pain Started by: Mechele Claude, MD   ELDERBERRY PO Take by mouth.   EPINEPHrine 0.3 mg/0.3 mL Soaj injection Commonly known as: EpiPen 2-Pak Inject 0.3 mLs (0.3 mg total) into the muscle once. FOR SEVERE ALLERGIC REACTIONS   fluticasone furoate-vilanterol 200-25 MCG/INH Aepb Commonly known as: BREO ELLIPTA Inhale 1 puff into the lungs at bedtime.   linaclotide 145 MCG Caps capsule Commonly known as: Linzess Take 1 capsule (145 mcg total) by mouth daily. To regulate bowel movements   meloxicam 15 MG tablet Commonly known  as: MOBIC Take 1 tablet (15 mg total) by mouth daily. For joint and muscle pain   montelukast 10 MG tablet Commonly known as: SINGULAIR Take 1 tablet (10 mg total) by mouth daily.   rOPINIRole 2 MG tablet Commonly known as: REQUIP Take 0.5 tablets (1 mg total) by mouth at bedtime.   tizanidine 6 MG capsule Commonly known as: ZANAFLEX Take 1 capsule (6 mg total) by mouth 3 (three) times daily as needed for muscle spasms.   WOMENS MULTIVITAMIN PO Take by mouth.         Follow-up: No follow-ups on file.  Mechele Claude, M.D.

## 2021-01-25 ENCOUNTER — Encounter: Payer: Self-pay | Admitting: Family Medicine

## 2021-04-22 ENCOUNTER — Ambulatory Visit: Payer: 59 | Admitting: Family Medicine

## 2021-05-08 ENCOUNTER — Encounter: Payer: Self-pay | Admitting: Family Medicine

## 2021-06-07 ENCOUNTER — Encounter: Payer: Self-pay | Admitting: Family Medicine

## 2021-06-07 ENCOUNTER — Other Ambulatory Visit: Payer: Self-pay

## 2021-06-07 ENCOUNTER — Ambulatory Visit: Payer: 59 | Admitting: Family Medicine

## 2021-06-07 VITALS — BP 122/74 | HR 90 | Temp 97.7°F | Wt 178.0 lb

## 2021-06-07 DIAGNOSIS — R6889 Other general symptoms and signs: Secondary | ICD-10-CM | POA: Diagnosis not present

## 2021-06-07 DIAGNOSIS — G2581 Restless legs syndrome: Secondary | ICD-10-CM

## 2021-06-07 DIAGNOSIS — J4520 Mild intermittent asthma, uncomplicated: Secondary | ICD-10-CM

## 2021-06-07 DIAGNOSIS — D508 Other iron deficiency anemias: Secondary | ICD-10-CM | POA: Diagnosis not present

## 2021-06-07 MED ORDER — LINACLOTIDE 290 MCG PO CAPS: 290.0000 ug | ORAL_CAPSULE | Freq: Every day | ORAL | 3 refills | Status: DC

## 2021-06-07 MED ORDER — CARBIDOPA-LEVODOPA 10-100 MG PO TABS: ORAL_TABLET | ORAL | 3 refills | Status: DC

## 2021-06-07 MED ORDER — ROPINIROLE HCL 2 MG PO TABS: 1.0000 mg | ORAL_TABLET | Freq: Every day | ORAL | 3 refills | Status: DC

## 2021-06-07 MED ORDER — MONTELUKAST SODIUM 10 MG PO TABS: 10.0000 mg | ORAL_TABLET | Freq: Every day | ORAL | 3 refills | Status: DC

## 2021-06-07 NOTE — Progress Notes (Signed)
Subjective:  Patient ID: Carla Li, female    DOB: Nov 14, 1975  Age: 45 y.o. MRN: [id number]  CC: Follow-up   HPI Carla Li presents for being forgetful. Husband says she is scatterbrained. Noted to be worsening for a few weeks. Eye sight has changed. Had Covid in January.   Linzess worked at first, but quit working so she went to [employer] 1/2 cap daily.  Some days feeling mildly short winded. Albuterol works. Breo caused tachycardia. Husband has been noticing she wheezes.  HEadache - sinus. Occurring 2-3/week. Gets sneezing and watery eyes.   Depression screen [facility] 2/9 06/07/2021 06/07/2021 01/20/2021  Decreased Interest 0 0 0  Down, Depressed, Hopeless 0 0 0  PHQ - 2 Score 0 0 0  Altered sleeping 0 - 3  Tired, decreased energy 3 - 3  Change in appetite 1 - 3  Feeling bad or failure about yourself  0 - 0  Trouble concentrating 0 - 2  Moving slowly or fidgety/restless 0 - 0  Suicidal thoughts 0 - 0  PHQ-9 Score 4 - 11  Difficult doing work/chores Somewhat difficult - -    History Carla Li has a past medical history of Asthma and Diabetes mellitus without complication ([place]).   She has a past surgical history that includes Abdominal hysterectomy and tube placed and removed in ears.   Her family history includes Asthma in her sister; Celiac disease in her mother; Diabetes in her sister; Drug abuse in her brother; Eczema in her sister; Hearing loss in her sister; Heart murmur in her daughter; Hypotension in her mother; Seizures in her brother.She reports that she has quit smoking. She has never used smokeless tobacco. She reports that she does not currently use alcohol. She reports that she does not use drugs.    ROS Review of Systems  Constitutional: Negative.   HENT:  Negative for congestion.   Eyes:  Negative for visual disturbance.  Respiratory:  Positive for shortness of breath and wheezing.   Cardiovascular:  Negative for chest pain.  Gastrointestinal:  Negative for  abdominal pain, constipation, diarrhea, nausea and vomiting.  Genitourinary:  Negative for difficulty urinating.  Musculoskeletal:  Negative for arthralgias and myalgias.  Neurological:  Positive for headaches.  Psychiatric/Behavioral:  Positive for decreased concentration and sleep disturbance.    Objective:  BP 122/74   Pulse 90   Temp 97.7 F (36.5 C)   Wt 178 lb (80.7 kg)   SpO2 100%   BMI 27.06 kg/m   BP Readings from Last 3 Encounters:  06/07/21 122/74  01/20/21 122/77  12/08/20 126/85    Wt Readings from Last 3 Encounters:  06/07/21 178 lb (80.7 kg)  01/20/21 172 lb 6.4 oz (78.2 kg)  12/08/20 166 lb 12.8 oz (75.7 kg)     Physical Exam Constitutional:      General: She is not in acute distress.    Appearance: She is well-developed.  HENT:     Head: Normocephalic and atraumatic.  Eyes:     Conjunctiva/sclera: Conjunctivae normal.     Pupils: Pupils are equal, round, and reactive to light.  Neck:     Thyroid: No thyromegaly.  Cardiovascular:     Rate and Rhythm: Normal rate and regular rhythm.     Heart sounds: Normal heart sounds. No murmur heard. Pulmonary:     Effort: Pulmonary effort is normal. No respiratory distress.     Breath sounds: Normal breath sounds. No wheezing or rales.  Musculoskeletal:  General: Normal range of motion.     Cervical back: Normal range of motion and neck supple.  Lymphadenopathy:     Cervical: No cervical adenopathy.  Skin:    General: Skin is warm and dry.  Neurological:     Mental Status: She is alert and oriented to person, place, and time.  Psychiatric:        Behavior: Behavior normal.        Thought Content: Thought content normal.        Judgment: Judgment normal.      Assessment & Plan:   Carla Li was seen today for follow-up.  Diagnoses and all orders for this visit:  Other iron deficiency anemia -     CBC with Differential/Platelet -     CMP14+EGFR  Restless legs syndrome -     CBC with  Differential/Platelet -     CMP14+EGFR -     Magnesium  Mild intermittent asthma without complication -     CBC with Differential/Platelet -     CMP14+EGFR -     Ambulatory referral to Allergy  Forgetfulness  Other orders -     linaclotide (LINZESS) 290 MCG CAPS capsule; Take 1 capsule (290 mcg total) by mouth daily. To regulate bowel movements -     montelukast (SINGULAIR) 10 MG tablet; Take 1 tablet (10 mg total) by mouth daily. -     rOPINIRole (REQUIP) 2 MG tablet; Take 0.5 tablets (1 mg total) by mouth at bedtime. -     carbidopa-levodopa (SINEMET) 10-100 MG tablet; Take one daily after work for restless legs, and another at bedtime      I have discontinued Carla Li's fluticasone furoate-vilanterol. I have also changed her linaclotide and carbidopa-levodopa. Additionally, I am having her maintain her ELDERBERRY PO, Multiple Vitamins-Minerals (WOMENS MULTIVITAMIN PO), tizanidine, albuterol, Ventolin HFA, cetirizine, diclofenac, montelukast, rOPINIRole, and EPINEPHrine.  Allergies as of 06/07/2021       Reactions   Benadryl [diphenhydramine] Nausea Only   Sulfa Antibiotics Itching, Swelling, Anaphylaxis, Hives   Bactrim [sulfamethoxazole-trimethoprim]    Codeine    REACTION: nausea   Codeine Nausea Only   Penicillins    Pineapple    Sulfonamide Derivatives    REACTION: hives, itching   Wound Dressing Adhesive Rash   Paper tape causes rash-clear tape okay        Medication List        Accurate as of June 07, 2021  8:41 PM. If you have any questions, ask your nurse or doctor.          STOP taking these medications    fluticasone furoate-vilanterol 200-25 MCG/INH Aepb Commonly known as: BREO ELLIPTA Stopped by: Claretta Fraise, MD       TAKE these medications    albuterol 108 (90 Base) MCG/ACT inhaler Commonly known as: VENTOLIN HFA Inhale 2 puffs into the lungs every 4 (four) hours as needed for wheezing or shortness of breath. TWO PUFFS EVERY  4-6 HOURS FOR COUGH AND WHEEZE   Ventolin HFA 108 (90 Base) MCG/ACT inhaler Generic drug: albuterol Inhale 2 puffs into the lungs every 4 (four) hours as needed for wheezing or shortness of breath.   carbidopa-levodopa 10-100 MG tablet Commonly known as: Sinemet Take one daily after work for restless legs, and another at bedtime What changed: additional instructions Changed by: Claretta Fraise, MD   cetirizine 10 MG tablet Commonly known as: ZYRTEC Take 1 tablet (10 mg total) by mouth daily. For allergy symptoms  diclofenac 75 MG EC tablet Commonly known as: VOLTAREN One each evening as needed for foot pain   ELDERBERRY PO Take by mouth.   EPINEPHrine 0.3 mg/0.3 mL Soaj injection Commonly known as: EpiPen 2-Pak Inject 0.3 mLs (0.3 mg total) into the muscle once. FOR SEVERE ALLERGIC REACTIONS   linaclotide 290 MCG Caps capsule Commonly known as: Linzess Take 1 capsule (290 mcg total) by mouth daily. To regulate bowel movements What changed:  medication strength how much to take Changed by: Claretta Fraise, MD   montelukast 10 MG tablet Commonly known as: SINGULAIR Take 1 tablet (10 mg total) by mouth daily.   rOPINIRole 2 MG tablet Commonly known as: REQUIP Take 0.5 tablets (1 mg total) by mouth at bedtime.   tizanidine 6 MG capsule Commonly known as: ZANAFLEX Take 1 capsule (6 mg total) by mouth 3 (three) times daily as needed for muscle spasms.   WOMENS MULTIVITAMIN PO Take by mouth.         Follow-up: Return in about 3 months (around 09/07/2021).  Claretta Fraise, M.D.

## 2021-06-08 ENCOUNTER — Encounter: Payer: Self-pay | Admitting: Family Medicine

## 2021-06-08 LAB — CMP14+EGFR
ALT: 14 IU/L (ref 0–32)
AST: 16 IU/L (ref 0–40)
Albumin/Globulin Ratio: 1.3 (ref 1.2–2.2)
Albumin: 4.4 g/dL (ref 3.8–4.8)
Alkaline Phosphatase: 80 IU/L (ref 44–121)
BUN/Creatinine Ratio: 16 (ref 9–23)
BUN: 11 mg/dL (ref 6–24)
Bilirubin Total: 0.2 mg/dL (ref 0.0–1.2)
CO2: 22 mmol/L (ref 20–29)
Calcium: 8.7 mg/dL (ref 8.7–10.2)
Chloride: 105 mmol/L (ref 96–106)
Creatinine, Ser: 0.69 mg/dL (ref 0.57–1.00)
Globulin, Total: 3.3 g/dL (ref 1.5–4.5)
Glucose: 120 mg/dL — ABNORMAL HIGH (ref 70–99)
Potassium: 4.2 mmol/L (ref 3.5–5.2)
Sodium: 140 mmol/L (ref 134–144)
Total Protein: 7.7 g/dL (ref 6.0–8.5)
eGFR: 110 mL/min/1.73

## 2021-06-08 LAB — CBC WITH DIFFERENTIAL/PLATELET
Basophils Absolute: 0.1 x10E3/uL (ref 0.0–0.2)
Basos: 2 %
EOS (ABSOLUTE): 0.4 x10E3/uL (ref 0.0–0.4)
Eos: 6 %
Hematocrit: 39.9 % (ref 34.0–46.6)
Hemoglobin: 12.9 g/dL (ref 11.1–15.9)
Immature Grans (Abs): 0 x10E3/uL (ref 0.0–0.1)
Immature Granulocytes: 0 %
Lymphocytes Absolute: 2.4 x10E3/uL (ref 0.7–3.1)
Lymphs: 33 %
MCH: 26 pg — ABNORMAL LOW (ref 26.6–33.0)
MCHC: 32.3 g/dL (ref 31.5–35.7)
MCV: 80 fL (ref 79–97)
Monocytes Absolute: 0.9 x10E3/uL (ref 0.1–0.9)
Monocytes: 12 %
Neutrophils Absolute: 3.5 x10E3/uL (ref 1.4–7.0)
Neutrophils: 47 %
Platelets: 277 x10E3/uL (ref 150–450)
RBC: 4.97 x10E6/uL (ref 3.77–5.28)
RDW: 13.3 % (ref 11.7–15.4)
WBC: 7.3 x10E3/uL (ref 3.4–10.8)

## 2021-06-08 LAB — MAGNESIUM: Magnesium: 2.2 mg/dL (ref 1.6–2.3)

## 2021-06-08 NOTE — Progress Notes (Signed)
Hello [place],  Your lab result is normal and/or stable.Some minor variations that are not significant are commonly marked abnormal, but do not represent any medical problem for you.  Best regards, Jettson Crable, M.D.

## 2021-07-07 NOTE — Progress Notes (Signed)
New Patient Note  RE: Carla Li MRN: [id number] DOB: 06/20/1976 Date of Office Visit: 07/08/2021  Consult requested by: Mechele Claude, MD Primary care provider: Mechele Claude, MD  Chief Complaint: Asthma (Takes singular, zyrtec, and uses a nasal spray. July of 2022 she had covid and since then all her medications stopped working and she has been having a lot of headaches. Since stopping her allergy medication for this visit has had a lot of coughing, wheezing, shortness of breath. ) and Other (Has spots behind her ears that randomly appear not sure the cause - uses an eczema cream not sure the name white tub with blue letters )  History of Present Illness: I had the pleasure of seeing Carla Li for initial evaluation at the [facility] on 07/08/2021. She is a 45 y.o. female, who is referred here by Mechele Claude, MD for the evaluation of allergic rhinitis and asthma.  Asthma: She reports symptoms of chest tightness, shortness of breath, coughing, wheezing for many years. Current medications include albuterol prn which help. She reports not using aerochamber with inhalers. She tried the following inhalers: Breo - caused palpitations, Advair. Main triggers are unknown but it got worse after she got her second Covid-19 infections in July 2022.  In the last month, frequency of symptoms: daily. Frequency of nocturnal symptoms: 0x/month. Frequency of SABA use: <1x/week. Interference with physical activity: no. Sleep is undisturbed. In the last 12 months, emergency room visits/urgent care visits/doctor office visits or hospitalizations due to respiratory issues: 2. In the last 12 months, oral steroids courses: no. Lifetime history of hospitalization for respiratory issues: many times as a child. Prior intubations: no. Asthma was diagnosed at age 40. History of pneumonia: yes. She was not evaluated by pulmonologist in the past. Smoking exposure: stopped smoking in March 2021. Up  to date with flu vaccine: no. Up to date with COVID-19 vaccine: no. Prior Covid-19 infection: January 2022 and July 2022. History of reflux: no. No recent CXR.  Rhinitis: She reports symptoms of nasal congestion, rhinorrhea, itchy eyes. Symptoms have been going on for 25+ years. The symptoms are present all year around. Other triggers include exposure to unknown. Anosmia: no. Headache: yes. She has used Claritin, Flonase, Singulair with minimal improvement in symptoms.  Zyrtec helps the most. Sinus infections: yes - 1. Previous work up includes: skin testing in the past showed multiple positives per patient report. She got a few allergy shots and not sure why it was stopped.  Previous ENT evaluation: not recently, no prior sinus surgery. Previous sinus imaging: no. History of nasal polyps: no. Last eye exam: 2 years ago.  06/07/2021 PCP visit: "Some days feeling mildly short winded. Albuterol works. Breo caused tachycardia. Husband has been noticing she wheezes.   Headache - sinus. Occurring 2-3/week. Gets sneezing and watery eyes."  Assessment and Plan: Ettel is a 45 y.o. female with: Asthma Diagnosed with asthma at age 23.  With frequent hospitalizations as a child and asthma became quiescent as a young adult.  She was doing well until she had her second COVID infection in July 2022.  Since then having daily symptoms however does not like to use albuterol due to its side effects - shaking and jitteriness.  Breo caused palpitations. No recent chest x-ray.  Not up-to-date with COVID-19 vaccines. Today's spirometry showed some restriction with 5% improvement in FEV1 post bronchodilator treatment.  Clinically feeling improved. Daily controller medication(s): Arnuity 1 puff daily and rinse mouth  after each use. Continue Singulair (montelukast)  daily at night. During upper respiratory infections/asthma flares:  INCREASE Arnuity 1 puff to TWICE a day for 1-2 weeks until your  breathing symptoms return to baseline.  Pretreat with levoalbuterol 2 puffs May use levoalbuterol rescue inhaler 2 puffs every 4 to 6 hours as needed for shortness of breath, chest tightness, coughing, and wheezing. Monitor frequency of use.  Get spirometry at next visit.  Other allergic rhinitis Perennial rhinoconjunctivitis symptoms for 25+ years.  Skin testing in the past showed multiple positives and was on allergy shots briefly.  Zyrtec and Singulair seems to help the most. Today's skin testing showed: Positive to grass, ragweed, weed pollen, trees, mold, dust mites, cat, dog, cockroach.  Borderline to feathers. Start environmental control measures as below. Continue Singulair (montelukast)  daily at night. Use over the counter antihistamines such as Zyrtec (cetirizine), Claritin (loratadine), Allegra (fexofenadine), or Xyzal (levocetirizine) daily as needed. May take twice a day during allergy flares. May switch antihistamines every few months.  Dermatitis Rash behind the ears where her hearing aids are. Concerning for contact dermatitis. Advised to see if there's covering for hearing aids that she can use to minimize contact with the skin.  Use desonide 0.05% ointment twice a day as needed for mild rash flares - okay to use on the face, neck, groin area. Do not use more than 1 week at a time.  Vaccine counseling Recommend Covid-19 vaccinations. Discussed risks and benefits. Patient is open to receiving the injections.   Return in about 2 months (around 09/08/2021).  Meds ordered this encounter  Medications   desonide (DESOWEN) 0.05 % ointment    Sig: Apply 1 application topically 2 (two) times daily as needed (mild rash flare). Okay to use on the face, neck, groin area. Do not use more than 1 week at a time.    Dispense:  60 g    Refill:  2   levalbuterol (XOPENEX HFA) 45 MCG/ACT inhaler    Sig: Inhale 2 puffs into the lungs every 4 (four) hours as needed for wheezing or  shortness of breath (coughing fits).    Dispense:  1 each    Refill:  2   Fluticasone Furoate (ARNUITY ELLIPTA) 100 MCG/ACT AEPB    Sig: Inhale 1 puff into the lungs daily. Rinse mouth after each use.    Dispense:  30 each    Refill:  5    Lab Orders  No laboratory test(s) ordered today    Other allergy screening: Food allergy:  contact rash with fresh citrus Medication allergy: yes Hymenoptera allergy:  Some chest tightness with certain insect bites - it's not a bee or an ant. Urticaria: no Eczema:yes  History of recurrent infections suggestive of immunodeficency:  frequent infections as a child but not as an adult  Diagnostics: Spirometry:  Tracings reviewed. Her effort: Good reproducible efforts. FVC: 2.67L FEV1: 2.12L, 66% predicted FEV1/FVC ratio: 79% Interpretation: Spirometry consistent with possible restrictive disease with 5% improvement in FEV1 post bronchodilator treatment. Clinically feeling improved.   Please see scanned spirometry results for details.  Skin Testing: Environmental allergy panel and select foods. Positive to grass, ragweed, weed pollen, trees, mold, dust mites, cat, dog, cockroach.  Borderline to feathers. Negative to common foods. Results discussed with patient/family.  Airborne Adult Perc - 07/08/21 1058     Time Antigen Placed 1055    Allergen Manufacturer Waynette Buttery    Location Back    Number of Test 59  Panel 1 Select    1. Control-Buffer 50% Glycerol Negative    2. Control-Histamine 1 mg/ml 2+    3. Albumin saline Negative    4. Bahia Negative    5. French [place] Territ[place] Negative    6. Johnson Negative    7. Kentucky Blue Negative    8. Meadow Fescue Negative    9. Perennial Rye Negative    10. Sweet Vernal Negative    11. Timothy Negative    12. Cocklebur Negative    13. Burweed Marshelder Negative    14. Ragweed, short 2+    15. Ragweed, Giant 2+    16. Plantain,  English Negative    17. Lamb's Quarters Negative    18. Sheep Sorrell  Negative    19. Rough Pigweed Negative    20. Marsh Elder, Rough Negative    21. Mugwort, Common Negative    22. Ash mix Negative    23. Birch mix Negative    24. Beech American Negative    25. Box, Elder Negative    26. Cedar, red Negative    27. Cottonwood, Guinea-Bissau Negative    28. Elm mix Negative    29. Hickory Negative    30. Maple mix Negative    31. Oak, Guinea-Bissau mix Negative    32. Pecan Pollen Negative    33. Pine mix Negative    34. Sycamore Eastern Negative    35. Walnut, Black Pollen Negative    36. Alternaria alternata Negative    37. Cladosporium Herbarum Negative    38. Aspergillus mix Negative    39. Penicillium mix Negative    40. Bipolaris sorokiniana (Helminthosporium) Negative    41. Drechslera spicifera (Curvularia) Negative    42. Mucor plumbeus Negative    43. Fusarium moniliforme Negative    44. Aureobasidium pullulans (pullulara) Negative    45. Rhizopus oryzae Negative    46. Botrytis cinera Negative    47. Epicoccum nigrum Negative    48. Phoma betae Negative    49. Candida Albicans Negative    50. Trichophyton mentagrophytes Negative    51. Mite, D Farinae  5,000 AU/ml 3+    52. Mite, D Pteronyssinus  5,000 AU/ml 3+    53. Cat Hair 10,000 BAU/ml 2+    54.  Dog Epithelia Negative    55. Mixed Feathers --   +/-   56. Horse Epithelia Negative    57. Cockroach, German Negative    58. Mouse Negative    59. Tobacco Leaf Negative             Food Perc - 07/08/21 1058       Test Information   Time Antigen Placed 1055    Allergen Manufacturer Waynette Buttery    Location Back    Number of allergen test 10    Food Select      Food   1. Peanut Negative    2. Soybean food Negative    3. Wheat, whole Negative    4. Sesame Negative    5. Milk, cow Negative    6. Egg White, chicken Negative    7. Casein Negative    8. Shellfish mix Negative    9. Fish mix Negative    10. Cashew Negative             Intradermal - 07/08/21 1114     Time  Antigen Placed 1115    Allergen Manufacturer Waynette Buttery    Location Arm    Number of  Test 12    Control Negative    French [place] Territ[place] 2+    Johnson 2+    7 Grass 2+    Weed mix 2+    Tree mix 2+    Mold 1 2+    Mold 2 2+    Mold 3 Negative    Mold 4 2+    Cat 2+    Cockroach 2+             Past Medical History: Patient Active Problem List   Diagnosis Date Noted   Dermatitis 07/08/2021   Vaccine counseling 07/08/2021   Deaf, bilateral 07/16/2020   Chronic eczematous otitis externa of both ears 02/21/2018   Eustachian tube dysfunction, bilateral 02/21/2018   Adenomyosis 11/01/2016   Restless legs syndrome 11/26/2015   Asthma 01/29/2015   Other allergic rhinitis 01/29/2015   Otitis externa 01/29/2015   HYPOKALEMIA 07/30/2009   ANEMIA-IRON DEFICIENCY 07/30/2009   DIABETES MELLITUS, BORDERLINE 07/30/2009   Past Medical History:  Diagnosis Date   Asthma    Diabetes mellitus without complication (HCC)    Past Surgical History: Past Surgical History:  Procedure Laterality Date   ABDOMINAL HYSTERECTOMY     tube placed and removed in ears     Medication List:  Current Outpatient Medications  Medication Sig Dispense Refill   carbidopa-levodopa (SINEMET) 10-100 MG tablet Take one daily after work for restless legs, and another at bedtime 90 tablet 3   cetirizine (ZYRTEC) 10 MG tablet Take 1 tablet (10 mg total) by mouth daily. For allergy symptoms 90 tablet 3   desonide (DESOWEN) 0.05 % ointment Apply 1 application topically 2 (two) times daily as needed (mild rash flare). Okay to use on the face, neck, groin area. Do not use more than 1 week at a time. 60 g 2   diclofenac (VOLTAREN) 75 MG EC tablet One each evening as needed for foot pain 90 tablet 1   ELDERBERRY PO Take by mouth.     EPINEPHrine (EPIPEN 2-PAK) 0.3 mg/0.3 mL IJ SOAJ injection Inject 0.3 mLs (0.3 mg total) into the muscle once. FOR SEVERE ALLERGIC REACTIONS 2 Device 0   Fluticasone Furoate (ARNUITY ELLIPTA) 100  MCG/ACT AEPB Inhale 1 puff into the lungs daily. Rinse mouth after each use. 30 each 5   levalbuterol (XOPENEX HFA) 45 MCG/ACT inhaler Inhale 2 puffs into the lungs every 4 (four) hours as needed for wheezing or shortness of breath (coughing fits). 1 each 2   linaclotide (LINZESS) 290 MCG CAPS capsule Take 1 capsule (290 mcg total) by mouth daily. To regulate bowel movements 90 capsule 3   montelukast (SINGULAIR) 10 MG tablet Take 1 tablet (10 mg total) by mouth daily. 90 tablet 3   Multiple Vitamins-Minerals (WOMENS MULTIVITAMIN PO) Take by mouth.     rOPINIRole (REQUIP) 2 MG tablet Take 0.5 tablets (1 mg total) by mouth at bedtime. 90 tablet 3   tizanidine (ZANAFLEX) 6 MG capsule Take 1 capsule (6 mg total) by mouth 3 (three) times daily as needed for muscle spasms. 90 capsule 1   No current facility-administered medications for this visit.   Allergies: Allergies  Allergen Reactions   Benadryl [Diphenhydramine] Nausea Only   Sulfa Antibiotics Itching, Swelling, Anaphylaxis and Hives   Bactrim [Sulfamethoxazole-Trimethoprim]    Codeine     REACTION: nausea   Codeine Nausea Only   Penicillins    Pineapple    Sulfonamide Derivatives     REACTION: hives, itching   Wound Dressing Adhesive Rash  Paper tape causes rash-clear tape okay   Social History: Social History   Socioeconomic History   Marital status: Married    Spouse name: Ray   Number of children: 2   Years of education: Not on file   Highest education level: Some college, no degree  Occupational History   Not on file  Tobacco Use   Smoking status: Former   Smokeless tobacco: Never  Building services engineer Use: Never used  Substance and Sexual Activity   Alcohol use: Not Currently   Drug use: Never   Sexual activity: Not Currently    Birth control/protection: Surgical  Other Topics Concern   Not on file  Social History Narrative   Not on file   Social Determinants of Health   Financial Resource Strain: Not on  file  Food Insecurity: Not on file  Transportation Needs: Not on file  Physical Activity: Not on file  Stress: Not on file  Social Connections: Not on file   Lives in a 1 year house. Smoking: quit in 2021 Occupation: Animator History: Water Damage/mildew in the house: no Engineer, civil (consulting) in the family room: no Carpet in the bedroom: yes Heating: heat pump Cooling: heat pump Pet: yes 2 dogs x 11 yrs  Family History: Family History  Problem Relation Age of Onset   Celiac disease Mother    Hypotension Mother    Asthma Sister    Diabetes Sister    Hearing loss Sister    Eczema Sister    Heart murmur Daughter    Drug abuse Brother    Seizures Brother    Review of Systems  Constitutional:  Negative for appetite change, chills, fever and unexpected weight change.  HENT:  Positive for congestion, rhinorrhea and sneezing.   Eyes:  Negative for itching.  Respiratory:  Positive for cough, chest tightness, shortness of breath and wheezing.   Cardiovascular:  Negative for chest pain.  Gastrointestinal:  Positive for constipation. Negative for abdominal pain.  Genitourinary:  Negative for difficulty urinating.  Skin:  Positive for rash.  Allergic/Immunologic: Positive for environmental allergies. Negative for food allergies.  Neurological:  Negative for headaches.   Objective: BP 120/72   Pulse 76   Temp 98.2 F (36.8 C)   Resp 20   Ht 5\' 7"  (1.702 m)   Wt 177 lb 6.4 oz (80.5 kg)   SpO2 98%   BMI 27.78 kg/m  Body mass index is 27.78 kg/m. Physical Exam Vitals and nursing note reviewed.  Constitutional:      Appearance: Normal appearance. She is well-developed.  HENT:     Head: Normocephalic and atraumatic.     Right Ear: Tympanic membrane and external ear normal.     Left Ear: Tympanic membrane and external ear normal.     Nose: Nose normal.     Mouth/Throat:     Mouth: Mucous membranes are moist.     Pharynx: Oropharynx is clear.  Eyes:      Conjunctiva/sclera: Conjunctivae normal.  Cardiovascular:     Rate and Rhythm: Normal rate and regular rhythm.     Heart sounds: Normal heart sounds. No murmur heard.   No friction rub. No gallop.  Pulmonary:     Effort: Pulmonary effort is normal.     Breath sounds: Normal breath sounds. No wheezing, rhonchi or rales.  Musculoskeletal:     Cervical back: Neck supple.  Skin:    General: Skin is warm.     Findings: Rash present.  Comments: Erythematous patch bilaterally behind the ears - patient wears hearing aides  Neurological:     Mental Status: She is alert and oriented to person, place, and time.  Psychiatric:        Behavior: Behavior normal.  The plan was reviewed with the patient/family, and all questions/concerned were addressed.  It was my pleasure to see Karelyn today and participate in her care. Please feel free to contact me with any questions or concerns.  Sincerely,  Wyline Mood, DO Allergy & Immunology  [facility] office: [phone] [facility] office: [phone]

## 2021-07-08 ENCOUNTER — Encounter: Payer: Self-pay | Admitting: Allergy

## 2021-07-08 ENCOUNTER — Ambulatory Visit: Payer: 59 | Admitting: Allergy

## 2021-07-08 ENCOUNTER — Other Ambulatory Visit: Payer: Self-pay

## 2021-07-08 ENCOUNTER — Telehealth: Payer: Self-pay

## 2021-07-08 VITALS — BP 120/72 | HR 76 | Temp 98.2°F | Resp 20 | Ht 67.0 in | Wt 177.4 lb

## 2021-07-08 DIAGNOSIS — Z7185 Encounter for immunization safety counseling: Secondary | ICD-10-CM | POA: Diagnosis not present

## 2021-07-08 DIAGNOSIS — L309 Dermatitis, unspecified: Secondary | ICD-10-CM | POA: Diagnosis not present

## 2021-07-08 DIAGNOSIS — J3089 Other allergic rhinitis: Secondary | ICD-10-CM | POA: Diagnosis not present

## 2021-07-08 DIAGNOSIS — J45909 Unspecified asthma, uncomplicated: Secondary | ICD-10-CM

## 2021-07-08 MED ORDER — DESONIDE 0.05 % EX OINT: 1.0000 | TOPICAL_OINTMENT | Freq: Two times a day (BID) | CUTANEOUS | 2 refills | Status: AC | PRN

## 2021-07-08 MED ORDER — LEVALBUTEROL TARTRATE 45 MCG/ACT IN AERO: 2.0000 | INHALATION_SPRAY | RESPIRATORY_TRACT | 2 refills | Status: AC | PRN

## 2021-07-08 MED ORDER — EPINEPHRINE 0.3 MG/0.3ML IJ SOAJ: 0.3000 mg | INTRAMUSCULAR | 1 refills | Status: AC | PRN

## 2021-07-08 MED ORDER — ARNUITY ELLIPTA 100 MCG/ACT IN AEPB: 1.0000 | INHALATION_SPRAY | Freq: Every day | RESPIRATORY_TRACT | 5 refills | Status: DC

## 2021-07-08 NOTE — Assessment & Plan Note (Addendum)
Diagnosed with asthma at age 45.  With frequent hospitalizations as a child and asthma became quiescent as a young adult.  She was doing well until she had her second COVID infection in July 2022.  Since then having daily symptoms however does not like to use albuterol due to its side effects - shaking and jitteriness.  Breo caused palpitations. No recent chest x-ray.  Not up-to-date with COVID-19 vaccines.  Today's spirometry showed some restriction with 5% improvement in FEV1 post bronchodilator treatment.  Clinically feeling improved. . Daily controller medication(s): Arnuity 1 puff daily and rinse mouth after each use. . Continue Singulair (montelukast) 10mg  daily at night. . During upper respiratory infections/asthma flares:  o INCREASE Arnuity 1 puff to TWICE a day for 1-2 weeks until your breathing symptoms return to baseline.  o Pretreat with levoalbuterol 2 puffs . May use levoalbuterol rescue inhaler 2 puffs every 4 to 6 hours as needed for shortness of breath, chest tightness, coughing, and wheezing. Monitor frequency of use.  . Get spirometry at next visit.

## 2021-07-08 NOTE — Telephone Encounter (Signed)
I sent in an epi pen for insect stings. Patient was stung by a bee in the past and experienced tightness in her chest. Her current epi pens have expired and she request for Dr. Selena Batten to send in an up to date epi pen.

## 2021-07-08 NOTE — Patient Instructions (Addendum)
Today's skin testing showed: Positive to grass, ragweed, weed pollen, trees, mold, dust mites, cat, dog, cockroach.  Borderline to feathers. Negative to common foods. Results given.  Asthma:  Daily controller medication(s): Arnuity 1 puff daily and rinse mouth after each use. During upper respiratory infections/asthma flares:  INCREASE Arnuity 1 puff to TWICE a day for 1-2 weeks until your breathing symptoms return to baseline.  Pretreat with levoalbuterol 2 puffs Sent in levoalbuterol (xopenex) as a rescue inhaler - this replaces albuterol and it should not make you shaky.  May use levoalbuterol rescue inhaler 2 puffs every 4 to 6 hours as needed for shortness of breath, chest tightness, coughing, and wheezing. Monitor frequency of use.  Asthma control goals:  Full participation in all desired activities (may need albuterol before activity) Albuterol use two times or less a week on average (not counting use with activity) Cough interfering with sleep two times or less a month Oral steroids no more than once a year No hospitalizations   Environmental allergies Start environmental control measures as below. Continue Singulair (montelukast) 10mg  daily at night. Use over the counter antihistamines such as Zyrtec (cetirizine), Claritin (loratadine), Allegra (fexofenadine), or Xyzal (levocetirizine) daily as needed. May take twice a day during allergy flares. May switch antihistamines every few months.  Rash behind ears Most likely contact irritation - not sure if you are sensitive to the hearing aids. Use desonide 0.05% ointment twice a day as needed for mild rash flares - okay to use on the face, neck, groin area. Do not use more than 1 week at a time.  Covid-19 vaccines:  Follow up in 2 months or sooner if needed.   Reducing Pollen Exposure Pollen seasons: trees (spring), grass (summer) and ragweed/weeds  (fall). Keep windows closed in your home and car to lower pollen exposure.  Install air conditioning in the bedroom and throughout the house if possible.  Avoid going out in dry windy days - especially early morning. Pollen counts are highest between 5 - 10 AM and on dry, hot and windy days.  Save outside activities for late afternoon or after a heavy rain, when pollen levels are lower.  Avoid mowing of grass if you have grass pollen allergy. Be aware that pollen can also be transported indoors on people and pets.  Dry your clothes in an automatic dryer rather than hanging them outside where they might collect pollen.  Rinse hair and eyes before bedtime. Mold Control Mold and fungi can grow on a variety of surfaces provided certain temperature and moisture conditions exist.  Outdoor molds grow on plants, decaying vegetation and soil. The major outdoor mold, Alternaria and Cladosporium, are found in very high numbers during hot and dry conditions. Generally, a late summer - fall peak is seen for common outdoor fungal spores. Rain will temporarily lower outdoor mold spore count, but counts rise rapidly when the rainy period ends. The most important indoor molds are Aspergillus and Penicillium. Dark, humid and poorly ventilated basements are ideal sites for mold growth. The next most common sites of mold growth are the bathroom and the kitchen. Outdoor (Seasonal) Mold Control Use air conditioning and keep windows closed. Avoid exposure to decaying vegetation. Avoid leaf raking. Avoid grain handling. Consider wearing a face mask if working in moldy areas.  Indoor (Perennial) Mold Control  Maintain humidity below 50%. Get rid of mold growth on hard surfaces with water, detergent and, if necessary, 5% bleach (do not mix with other cleaners).  Then dry the area completely. If mold covers an area more than 10 square feet, consider hiring an indoor environmental professional. For clothing, washing with  soap and water is best. If moldy items cannot be cleaned and dried, throw them away. Remove sources e.g. contaminated carpets. Repair and seal leaking roofs or pipes. Using dehumidifiers in damp basements may be helpful, but empty the water and clean units regularly to prevent mildew from forming. All rooms, especially basements, bathrooms and kitchens, require ventilation and cleaning to deter mold and mildew growth. Avoid carpeting on concrete or damp floors, and storing items in damp areas. Control of House Dust Mite Allergen Dust mite allergens are a common trigger of allergy and asthma symptoms. While they can be found throughout the house, these microscopic creatures thrive in warm, humid environments such as bedding, upholstered furniture and carpeting. Because so much time is spent in the bedroom, it is essential to reduce mite levels there.  Encase pillows, mattresses, and box springs in special allergen-proof fabric covers or airtight, zippered plastic covers.  Bedding should be washed weekly in hot water (130 F) and dried in a hot dryer. Allergen-proof covers are available for comforters and pillows that can't be regularly washed.  Wash the allergy-proof covers every few months. Minimize clutter in the bedroom. Keep pets out of the bedroom.  Keep humidity less than 50% by using a dehumidifier or air conditioning. You can buy a humidity measuring device called a hygrometer to monitor this.  If possible, replace carpets with hardwood, linoleum, or washable area rugs. If that's not possible, vacuum frequently with a vacuum that has a HEPA filter. Remove all upholstered furniture and non-washable window drapes from the bedroom. Remove all non-washable stuffed toys from the bedroom.  Wash stuffed toys weekly. Pet Allergen Avoidance: Contrary to popular opinion, there are no "hypoallergenic" breeds of dogs or cats. That is because people are not allergic to an animal's hair, but to an allergen  found in the animal's saliva, dander (dead skin flakes) or urine. Pet allergy symptoms typically occur within minutes. For some people, symptoms can build up and become most severe 8 to 12 hours after contact with the animal. People with severe allergies can experience reactions in public places if dander has been transported on the pet owners' clothing. Keeping an animal outdoors is only a partial solution, since homes with pets in the yard still have higher concentrations of animal allergens. Before getting a pet, ask your allergist to determine if you are allergic to animals. If your pet is already considered part of your family, try to minimize contact and keep the pet out of the bedroom and other rooms where you spend a great deal of time. As with dust mites, vacuum carpets often or replace carpet with a hardwood floor, tile or linoleum. High-efficiency particulate air (HEPA) cleaners can reduce allergen levels over time. While dander and saliva are the source of cat and dog allergens, urine is the source of allergens from rabbits, hamsters, mice and [place] pigs; so ask a non-allergic family member to clean the animal's cage. If you have a pet allergy, talk to your allergist about the potential for allergy immunotherapy (allergy shots). This strategy can often provide long-term relief. Cockroach Allergen Avoidance Cockroaches are often found in the homes of densely populated urban areas, schools or commercial buildings, but these creatures can lurk almost anywhere. This does not mean that you have a dirty house or living area. Block all areas where roaches can  enter the home. This includes crevices, wall cracks and windows.  Cockroaches need water to survive, so fix and seal all leaky faucets and pipes. Have an exterminator go through the house when your family and pets are gone to eliminate any remaining roaches. Keep food in lidded containers and put pet food dishes away after your pets are done  eating. Vacuum and sweep the floor after meals, and take out garbage and recyclables. Use lidded garbage containers in the kitchen. Wash dishes immediately after use and clean under stoves, refrigerators or toasters where crumbs can accumulate. Wipe off the stove and other kitchen surfaces and cupboards regularly.   Skin care recommendations  Bath time: Always use lukewarm water. AVOID very hot or cold water. Keep bathing time to 5-10 minutes. Do NOT use bubble bath. Use a mild soap and use just enough to wash the dirty areas. Do NOT scrub skin vigorously.  After bathing, pat dry your skin with a towel. Do NOT rub or scrub the skin.  Moisturizers and prescriptions:  ALWAYS apply moisturizers immediately after bathing (within 3 minutes). This helps to lock-in moisture. Use the moisturizer several times a day over the whole body. Good summer moisturizers include: Aveeno, CeraVe, Cetaphil. Good winter moisturizers include: Aquaphor, Vaseline, Cerave, Cetaphil, Eucerin, Vanicream. When using moisturizers along with medications, the moisturizer should be applied about one hour after applying the medication to prevent diluting effect of the medication or moisturize around where you applied the medications. When not using medications, the moisturizer can be continued twice daily as maintenance.  Laundry and clothing: Avoid laundry products with added color or perfumes. Use unscented hypo-allergenic laundry products such as Tide free, Cheer free & gentle, and All free and clear.  If the skin still seems dry or sensitive, you can try double-rinsing the clothes. Avoid tight or scratchy clothing such as wool. Do not use fabric softeners or dyer sheets.

## 2021-07-08 NOTE — Assessment & Plan Note (Signed)
Perennial rhinoconjunctivitis symptoms for 25+ years.  Skin testing in the past showed multiple positives and was on allergy shots briefly.  Zyrtec and Singulair seems to help the most.  Today's skin testing showed: Positive to grass, ragweed, weed pollen, trees, mold, dust mites, cat, dog, cockroach.  Borderline to feathers.  Start environmental control measures as below.  Continue Singulair (montelukast) 10mg  daily at night.  Use over the counter antihistamines such as Zyrtec (cetirizine), Claritin (loratadine), Allegra (fexofenadine), or Xyzal (levocetirizine) daily as needed. May take twice a day during allergy flares. May switch antihistamines every few months.

## 2021-07-08 NOTE — Assessment & Plan Note (Signed)
Rash behind the ears where her hearing aids are.  Concerning for contact dermatitis.  Advised to see if there's covering for hearing aids that she can use to minimize contact with the skin.  . Use desonide 0.05% ointment twice a day as needed for mild rash flares - okay to use on the face, neck, groin area. Do not use more than 1 week at a time.

## 2021-07-08 NOTE — Addendum Note (Signed)
Addended by: Orson Aloe on: 07/08/2021 04:45 PM   Modules accepted: Orders

## 2021-07-08 NOTE — Assessment & Plan Note (Addendum)
   Recommend Covid-19 vaccinations.  Discussed risks and benefits. Patient is open to receiving the injections.

## 2021-07-08 NOTE — Telephone Encounter (Signed)
Noted  

## 2021-08-02 ENCOUNTER — Encounter: Payer: Self-pay | Admitting: Family Medicine

## 2021-08-07 ENCOUNTER — Encounter: Payer: Self-pay | Admitting: Family Medicine

## 2021-08-09 ENCOUNTER — Other Ambulatory Visit: Payer: Self-pay | Admitting: Allergy

## 2021-08-10 ENCOUNTER — Telehealth: Payer: Self-pay

## 2021-08-10 MED ORDER — AEROCHAMBER PLUS MISC: 2 refills | Status: DC

## 2021-08-10 NOTE — Telephone Encounter (Signed)
Tried calling pt to explain dr Selena Batten was changing her arnuity once a day inhale to flovent due to insurance coverage. Pts phone just kept ringing no voicemail

## 2021-08-10 NOTE — Telephone Encounter (Signed)
Tried calling pt to explain change but phone line just kept ringing

## 2021-08-10 NOTE — Telephone Encounter (Signed)
Please advise to change from arnuity to flovent

## 2021-08-10 NOTE — Telephone Encounter (Signed)
Please call patient and tell her that her insurance won't cover the Arnuity once a day inhaler. Sent in Flovent 2 puffs BID with spacer and rinse mouth afterwards.

## 2021-08-11 MED ORDER — MONTELUKAST SODIUM 10 MG PO TABS: 10.0000 mg | ORAL_TABLET | Freq: Every day | ORAL | 0 refills | Status: DC

## 2021-09-09 ENCOUNTER — Ambulatory Visit: Payer: 59 | Admitting: Allergy

## 2021-09-09 DIAGNOSIS — J309 Allergic rhinitis, unspecified: Secondary | ICD-10-CM

## 2021-09-09 NOTE — Progress Notes (Deleted)
Follow Up Note  RE: Carla Li MRN: [id number] DOB: Aug 04, 1976 Date of Office Visit: 09/09/2021  Referring provider: Claretta Fraise, MD Primary care provider: Claretta Fraise, MD  Chief Complaint: No chief complaint on file.  History of Present Illness: I had the pleasure of seeing Carla Li for a follow up visit at the [facility] on 09/09/2021. She is a 46 y.o. female, who is being followed for asthma, allergic rhinoconjunctivitis, dermatitis. Her previous allergy office visit was on 07/08/2021 with Dr. Maudie Mercury. Today is a regular follow up visit.  Asthma Diagnosed with asthma at age 61.  With frequent hospitalizations as a child and asthma became quiescent as a young adult.  She was doing well until she had her second COVID infection in July 2022.  Since then having daily symptoms however does not like to use albuterol due to its side effects - shaking and jitteriness.  Breo caused palpitations. No recent chest x-ray.  Not up-to-date with COVID-19 vaccines. Today's spirometry showed some restriction with 5% improvement in FEV1 post bronchodilator treatment.  Clinically feeling improved. Daily controller medication(s): Arnuity 161mcg 1 puff daily and rinse mouth after each use. Continue Singulair (montelukast) 10mg  daily at night. During upper respiratory infections/asthma flares:  INCREASE Arnuity 144mcg 1 puff to TWICE a day for 1-2 weeks until your breathing symptoms return to baseline.  Pretreat with levoalbuterol 2 puffs May use levoalbuterol rescue inhaler 2 puffs every 4 to 6 hours as needed for shortness of breath, chest tightness, coughing, and wheezing. Monitor frequency of use.  Get spirometry at next visit.   Other allergic rhinitis Perennial rhinoconjunctivitis symptoms for 25+ years.  Skin testing in the past showed multiple positives and was on allergy shots briefly.  Zyrtec and Singulair seems to help the most. Today's skin testing showed: Positive to  grass, ragweed, weed pollen, trees, mold, dust mites, cat, dog, cockroach.  Borderline to feathers. Start environmental control measures as below. Continue Singulair (montelukast) 10mg  daily at night. Use over the counter antihistamines such as Zyrtec (cetirizine), Claritin (loratadine), Allegra (fexofenadine), or Xyzal (levocetirizine) daily as needed. May take twice a day during allergy flares. May switch antihistamines every few months.   Dermatitis Rash behind the ears where her hearing aids are. Concerning for contact dermatitis. Advised to see if there's covering for hearing aids that she can use to minimize contact with the skin.  Use desonide 0.05% ointment twice a day as needed for mild rash flares - okay to use on the face, neck, groin area. Do not use more than 1 week at a time.   Vaccine counseling Recommend Covid-19 vaccinations. Discussed risks and benefits. Patient is open to receiving the injections.    Return in about 2 months (around 09/08/2021).  I sent in an epi pen for insect stings. Patient was stung by a bee in the past and experienced tightness in her chest. Her current epi pens have expired and she request for Dr. Maudie Mercury to send in an up to date epi pen.   Tried calling pt to explain dr Maudie Mercury was changing her arnuity once a day inhale to flovent due to insurance coverage. Pts phone just kept ringing no voicemail     Assessment and Plan: Carla Li is a 46 y.o. female with: No problem-specific Assessment & Plan notes found for this encounter.  No follow-ups on file.  No orders of the defined types were placed in this encounter.  Lab Orders  No laboratory test(s) ordered today  Diagnostics: Spirometry:  Tracings reviewed. Her effort: {Blank single:19197::"Good reproducible efforts.","It was hard to get consistent efforts and there is a question as to whether this reflects a maximal maneuver.","Poor effort, data can not be interpreted."} FVC: ***L FEV1: ***L, ***%  predicted FEV1/FVC ratio: ***% Interpretation: {Blank single:19197::"Spirometry consistent with mild obstructive disease","Spirometry consistent with moderate obstructive disease","Spirometry consistent with severe obstructive disease","Spirometry consistent with possible restrictive disease","Spirometry consistent with mixed obstructive and restrictive disease","Spirometry uninterpretable due to technique","Spirometry consistent with normal pattern","No overt abnormalities noted given today's efforts"}.  Please see scanned spirometry results for details.  Skin Testing: {Blank single:19197::"Select foods","Environmental allergy panel","Environmental allergy panel and select foods","Food allergy panel","None","Deferred due to recent antihistamines use"}. *** Results discussed with patient/family.   Medication List:  Current Outpatient Medications  Medication Sig Dispense Refill   carbidopa-levodopa (SINEMET) 10-100 MG tablet Take one daily after work for restless legs, and another at bedtime 90 tablet 3   cetirizine (ZYRTEC) 10 MG tablet Take 1 tablet (10 mg total) by mouth daily. For allergy symptoms 90 tablet 3   desonide (DESOWEN) 0.05 % ointment Apply 1 application topically 2 (two) times daily as needed (mild rash flare). Okay to use on the face, neck, groin area. Do not use more than 1 week at a time. 60 g 2   diclofenac (VOLTAREN) 75 MG EC tablet One each evening as needed for foot pain 90 tablet 1   ELDERBERRY PO Take by mouth.     EPINEPHrine (EPIPEN 2-PAK) 0.3 mg/0.3 mL IJ SOAJ injection Inject 0.3 mg into the muscle as needed for anaphylaxis. FOR SEVERE ALLERGIC REACTIONS 2 each 1   fluticasone (FLOVENT HFA) 110 MCG/ACT inhaler Inhale 2 puffs into the lungs in the morning and at bedtime. with spacer and rinse mouth afterwards. 1 each 3   levalbuterol (XOPENEX HFA) 45 MCG/ACT inhaler Inhale 2 puffs into the lungs every 4 (four) hours as needed for wheezing or shortness of breath  (coughing fits). 1 each 2   linaclotide (LINZESS) 290 MCG CAPS capsule Take 1 capsule (290 mcg total) by mouth daily. To regulate bowel movements 90 capsule 3   montelukast (SINGULAIR) 10 MG tablet Take 1 tablet (10 mg total) by mouth daily. 90 tablet 0   Multiple Vitamins-Minerals (WOMENS MULTIVITAMIN PO) Take by mouth.     rOPINIRole (REQUIP) 2 MG tablet Take 0.5 tablets (1 mg total) by mouth at bedtime. 90 tablet 3   Spacer/Aero-Holding Chambers (AEROCHAMBER PLUS) inhaler Use as instructed 1 each 2   tizanidine (ZANAFLEX) 6 MG capsule Take 1 capsule (6 mg total) by mouth 3 (three) times daily as needed for muscle spasms. 90 capsule 1   No current facility-administered medications for this visit.   Allergies: Allergies  Allergen Reactions   Benadryl [Diphenhydramine] Nausea Only   Sulfa Antibiotics Itching, Swelling, Anaphylaxis and Hives   Bactrim [Sulfamethoxazole-Trimethoprim]    Codeine     REACTION: nausea   Codeine Nausea Only   Penicillins    Pineapple    Sulfonamide Derivatives     REACTION: hives, itching   Wound Dressing Adhesive Rash    Paper tape causes rash-clear tape okay   I reviewed her past medical history, social history, family history, and environmental history and no significant changes have been reported from her previous visit.  Review of Systems  Constitutional:  Negative for appetite change, chills, fever and unexpected weight change.  HENT:  Positive for congestion, rhinorrhea and sneezing.   Eyes:  Negative for itching.  Respiratory:  Positive for cough, chest tightness, shortness of breath and wheezing.   Cardiovascular:  Negative for chest pain.  Gastrointestinal:  Positive for constipation. Negative for abdominal pain.  Genitourinary:  Negative for difficulty urinating.  Skin:  Positive for rash.  Allergic/Immunologic: Positive for environmental allergies. Negative for food allergies.  Neurological:  Negative for headaches.   Objective: There  were no vitals taken for this visit. There is no height or weight on file to calculate BMI. Physical Exam Vitals and nursing note reviewed.  Constitutional:      Appearance: Normal appearance. She is well-developed.  HENT:     Head: Normocephalic and atraumatic.     Right Ear: Tympanic membrane and external ear normal.     Left Ear: Tympanic membrane and external ear normal.     Nose: Nose normal.     Mouth/Throat:     Mouth: Mucous membranes are moist.     Pharynx: Oropharynx is clear.  Eyes:     Conjunctiva/sclera: Conjunctivae normal.  Cardiovascular:     Rate and Rhythm: Normal rate and regular rhythm.     Heart sounds: Normal heart sounds. No murmur heard.   No friction rub. No gallop.  Pulmonary:     Effort: Pulmonary effort is normal.     Breath sounds: Normal breath sounds. No wheezing, rhonchi or rales.  Musculoskeletal:     Cervical back: Neck supple.  Skin:    General: Skin is warm.     Findings: Rash present.     Comments: Erythematous patch bilaterally behind the ears - patient wears hearing aides  Neurological:     Mental Status: She is alert and oriented to person, place, and time.  Psychiatric:        Behavior: Behavior normal.   Previous notes and tests were reviewed. The plan was reviewed with the patient/family, and all questions/concerned were addressed.  It was my pleasure to see Carla Li today and participate in her care. Please feel free to contact me with any questions or concerns.  Sincerely,  Rexene Alberts, DO Allergy & Immunology  [facility] office: [place] office: [phone]

## 2021-12-04 ENCOUNTER — Encounter: Payer: Self-pay | Admitting: Family Medicine

## 2021-12-06 ENCOUNTER — Ambulatory Visit: Payer: 59 | Admitting: Family Medicine

## 2021-12-15 ENCOUNTER — Encounter: Payer: Self-pay | Admitting: Family Medicine

## 2021-12-15 ENCOUNTER — Ambulatory Visit (INDEPENDENT_AMBULATORY_CARE_PROVIDER_SITE_OTHER): Payer: 59 | Admitting: Family Medicine

## 2021-12-15 VITALS — BP 125/63 | HR 76 | Temp 98.3°F | Ht 67.0 in | Wt 180.6 lb

## 2021-12-15 DIAGNOSIS — R7309 Other abnormal glucose: Secondary | ICD-10-CM | POA: Diagnosis not present

## 2021-12-15 DIAGNOSIS — R5383 Other fatigue: Secondary | ICD-10-CM | POA: Diagnosis not present

## 2021-12-15 DIAGNOSIS — D508 Other iron deficiency anemias: Secondary | ICD-10-CM | POA: Diagnosis not present

## 2021-12-15 DIAGNOSIS — Z13220 Encounter for screening for lipoid disorders: Secondary | ICD-10-CM | POA: Diagnosis not present

## 2021-12-15 MED ORDER — CIPROFLOXACIN-HYDROCORTISONE 0.2-1 % OT SUSP: 3.0000 [drp] | Freq: Two times a day (BID) | OTIC | 0 refills | Status: DC

## 2021-12-15 MED ORDER — CETIRIZINE HCL 10 MG PO TABS: 10.0000 mg | ORAL_TABLET | Freq: Every day | ORAL | 3 refills | Status: DC

## 2021-12-15 MED ORDER — FLUTICASONE FUROATE-VILANTEROL 100-25 MCG/ACT IN AEPB: 1.0000 | INHALATION_SPRAY | Freq: Every day | RESPIRATORY_TRACT | 11 refills | Status: DC

## 2021-12-15 NOTE — Progress Notes (Signed)
? ?Subjective:  ?Patient ID: Carla Li, female    DOB: April 15, 1976  Age: 46 y.o. MRN: [id number] ? ?CC: Medical Management of Chronic Issues ? ? ?HPI ?Carla Li presents forFollow-up of borderline diabetes. Patient  does not check blood sugar at home.  ?Patient denies symptoms such as polyuria, polydipsia, excessive hunger, nausea ?No significant hypoglycemic spells noted. ?Medications reviewed. Pt reports taking them regularly without complication/adverse reaction being reported today.  ? ?Fatigue ?Worn out at the end of the day. Carla Li. Has adequate energy to complete work tasks. Onset 6 weeks ago. Just finished school. 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  ? ?History of iron deficiency. Off iron due to constipation. Ferrous sulfate. DCed 1-2 years ago.  ? ?No wheezing or dyspnea noted with asthma.  ? ?History ?Carla Li has a past medical history of Asthma and Diabetes mellitus without complication (Carla Li).  ? ?She has a past surgical history that includes Abdominal hysterectomy and tube placed and removed in ears.  ? ?Her family history includes Asthma in her sister; Celiac disease in her mother; Diabetes in her sister; Drug abuse in her brother; Eczema in her sister; Hearing loss in her sister; Heart murmur in her daughter; Hypotension in her mother; Seizures in her brother.She reports that she has quit smoking. She has never used smokeless tobacco. She reports that she does not currently use alcohol. She reports that she does not use drugs. ? ?Current Outpatient Medications on File Prior to Visit  ?Medication Sig Dispense Refill  ? carbidopa-levodopa (SINEMET) 10-100 MG tablet Take one daily after work for restless legs, and another at bedtime 90 tablet 3  ? desonide (DESOWEN) 0.05 % ointment Apply 1 application topically 2 (two) times daily as needed (mild rash flare). Okay to use on the face, neck, groin area. Do not use more than 1 week at a time. 60 g 2  ? diclofenac (VOLTAREN) 75 MG EC tablet One each  evening as needed for foot pain 90 tablet 1  ? ELDERBERRY PO Take by mouth.    ? EPINEPHrine (EPIPEN 2-PAK) 0.3 mg/0.3 mL IJ SOAJ injection Inject 0.3 mg into the muscle as needed for anaphylaxis. FOR SEVERE ALLERGIC REACTIONS 2 each 1  ? levalbuterol (XOPENEX HFA) 45 MCG/ACT inhaler Inhale 2 puffs into the lungs every 4 (four) hours as needed for wheezing or shortness of breath (coughing fits). 1 each 2  ? montelukast (SINGULAIR) 10 MG tablet Take 1 tablet (10 mg total) by mouth daily. 90 tablet 0  ? Multiple Vitamins-Minerals (WOMENS MULTIVITAMIN PO) Take by mouth.    ? rOPINIRole (REQUIP) 2 MG tablet Take 0.5 tablets (1 mg total) by mouth at bedtime. 90 tablet 3  ? Spacer/Aero-Holding Chambers (AEROCHAMBER PLUS) inhaler Use as instructed 1 each 2  ? tizanidine (ZANAFLEX) 6 MG capsule Take 1 capsule (6 mg total) by mouth 3 (three) times daily as needed for muscle spasms. 90 capsule 1  ? ?No current facility-administered medications on file prior to visit.  ? ? ?ROS ?Review of Systems  ?Constitutional: Negative.   ?HENT: Negative.    ?Eyes:  Negative for visual disturbance.  ?Respiratory:  Negative for shortness of breath.   ?Cardiovascular:  Negative for chest pain.  ?Gastrointestinal:  Negative for abdominal pain.  ?Musculoskeletal:  Negative for arthralgias.  ? ?Objective:  ?BP 125/63   Pulse 76   Temp 98.3 ?F (36.8 ?C)   Ht 5' 7"  (1.702 m)   Wt 180 lb 9.6 oz (81.9 kg)  SpO2 99%   BMI 28.29 kg/m?  ? ?BP Readings from Last 3 Encounters:  ?12/15/21 125/63  ?07/08/21 120/72  ?06/07/21 122/74  ? ? ?Wt Readings from Last 3 Encounters:  ?12/15/21 180 lb 9.6 oz (81.9 kg)  ?07/08/21 177 lb 6.4 oz (80.5 kg)  ?06/07/21 178 lb (80.7 kg)  ? ? ? ?Physical Exam ?Constitutional:   ?   General: She is not in acute distress. ?   Appearance: She is well-developed.  ?Cardiovascular:  ?   Rate and Rhythm: Normal rate and regular rhythm.  ?Pulmonary:  ?   Breath sounds: Normal breath sounds.  ?Musculoskeletal:     ?    General: Normal range of motion.  ?Skin: ?   General: Skin is warm and dry.  ?Neurological:  ?   Mental Status: She is alert and oriented to person, place, and time.  ? ? ? ? ?Assessment & Plan:  ? ?Gabrial was seen today for medical management of chronic issues. ? ?Diagnoses and all orders for this visit: ? ?DIABETES MELLITUS, BORDERLINE ?-     CBC with Differential/Platelet ?-     CMP14+EGFR ?-     Bayer DCA Hb A1c Waived ? ?Other iron deficiency anemia ? ?Lipid screening ?-     Lipid panel ? ?Fatigue, unspecified type ?-     TSH + free T4 ? ?Other orders ?-     cetirizine (ZYRTEC) 10 MG tablet; Take 1 tablet (10 mg total) by mouth daily. For allergy symptoms ?-     ciprofloxacin-hydrocortisone (CIPRO HC OTIC) OTIC suspension; Place 3 drops into both ears 2 (two) times daily. For one week ?-     fluticasone furoate-vilanterol (BREO ELLIPTA) 100-25 MCG/ACT AEPB; Inhale 1 puff into the lungs daily. ? ? ? ? ? ?I have discontinued Carla Li's linaclotide and fluticasone. I am also having her start on ciprofloxacin-hydrocortisone and fluticasone furoate-vilanterol. Additionally, I am having her maintain her ELDERBERRY PO, Multiple Vitamins-Minerals (WOMENS MULTIVITAMIN PO), tizanidine, diclofenac, rOPINIRole, carbidopa-levodopa, desonide, levalbuterol, EPINEPHrine, montelukast, AeroChamber Plus, and cetirizine. ? ?Meds ordered this encounter  ?Medications  ? cetirizine (ZYRTEC) 10 MG tablet  ?  Sig: Take 1 tablet (10 mg total) by mouth daily. For allergy symptoms  ?  Dispense:  90 tablet  ?  Refill:  3  ? ciprofloxacin-hydrocortisone (CIPRO HC OTIC) OTIC suspension  ?  Sig: Place 3 drops into both ears 2 (two) times daily. For one week  ?  Dispense:  10 mL  ?  Refill:  0  ? fluticasone furoate-vilanterol (BREO ELLIPTA) 100-25 MCG/ACT AEPB  ?  Sig: Inhale 1 puff into the lungs daily.  ?  Dispense:  1 each  ?  Refill:  11  ? ? ? ?Follow-up: Return in about 6 months (around 06/17/2022), or if symptoms worsen or fail to  improve. ? ?Claretta Fraise, M.D. ?

## 2021-12-16 LAB — CBC WITH DIFFERENTIAL/PLATELET
Basophils Absolute: 0.1 10*3/uL (ref 0.0–0.2)
Basos: 1 %
EOS (ABSOLUTE): 0.3 10*3/uL (ref 0.0–0.4)
Eos: 3 %
Hematocrit: 36.2 % (ref 34.0–46.6)
Hemoglobin: 11.8 g/dL (ref 11.1–15.9)
Immature Grans (Abs): 0 10*3/uL (ref 0.0–0.1)
Immature Granulocytes: 0 %
Lymphocytes Absolute: 2.5 10*3/uL (ref 0.7–3.1)
Lymphs: 27 %
MCH: 26.8 pg (ref 26.6–33.0)
MCHC: 32.6 g/dL (ref 31.5–35.7)
MCV: 82 fL (ref 79–97)
Monocytes Absolute: 1.1 10*3/uL — ABNORMAL HIGH (ref 0.1–0.9)
Monocytes: 12 %
Neutrophils Absolute: 5.4 10*3/uL (ref 1.4–7.0)
Neutrophils: 57 %
Platelets: 228 10*3/uL (ref 150–450)
RBC: 4.4 x10E6/uL (ref 3.77–5.28)
RDW: 14.4 % (ref 11.7–15.4)
WBC: 9.4 10*3/uL (ref 3.4–10.8)

## 2021-12-16 LAB — CMP14+EGFR
ALT: 17 IU/L (ref 0–32)
AST: 16 IU/L (ref 0–40)
Albumin/Globulin Ratio: 1.6 (ref 1.2–2.2)
Albumin: 4.4 g/dL (ref 3.8–4.8)
Alkaline Phosphatase: 84 IU/L (ref 44–121)
BUN/Creatinine Ratio: 19 (ref 9–23)
BUN: 13 mg/dL (ref 6–24)
Bilirubin Total: <0.2 mg/dL (ref 0.0–1.2)
CO2: 23 mmol/L (ref 20–29)
Calcium: 9.2 mg/dL (ref 8.7–10.2)
Chloride: 102 mmol/L (ref 96–106)
Creatinine, Ser: 0.69 mg/dL (ref 0.57–1.00)
Globulin, Total: 2.7 g/dL (ref 1.5–4.5)
Glucose: 141 mg/dL — ABNORMAL HIGH (ref 70–99)
Potassium: 3.9 mmol/L (ref 3.5–5.2)
Sodium: 140 mmol/L (ref 134–144)
Total Protein: 7.1 g/dL (ref 6.0–8.5)
eGFR: 110 mL/min/{1.73_m2} (ref >59)

## 2021-12-16 LAB — LIPID PANEL
Chol/HDL Ratio: 3 ratio (ref 0.0–4.4)
Cholesterol, Total: 178 mg/dL (ref 100–199)
HDL: 60 mg/dL (ref >39)
LDL Chol Calc (NIH): 96 mg/dL (ref 0–99)
Triglycerides: 127 mg/dL (ref 0–149)
VLDL Cholesterol Cal: 22 mg/dL (ref 5–40)

## 2021-12-16 LAB — TSH+FREE T4
Free T4: 1.02 ng/dL (ref 0.82–1.77)
TSH: 2.83 u[IU]/mL (ref 0.450–4.500)

## 2021-12-16 LAB — BAYER DCA HB A1C WAIVED: HB A1C (BAYER DCA - WAIVED): 5.4 % (ref 4.8–5.6)

## 2021-12-16 NOTE — Progress Notes (Signed)
Hello Tachina,  Your lab result is normal and/or stable.Some minor variations that are not significant are commonly marked abnormal, but do not represent any medical problem for you.  Best regards, Tiffine Henigan, M.D.

## 2022-01-13 ENCOUNTER — Other Ambulatory Visit: Payer: Self-pay | Admitting: Family Medicine

## 2022-01-17 ENCOUNTER — Encounter: Payer: Self-pay | Admitting: Family Medicine

## 2022-01-17 ENCOUNTER — Ambulatory Visit: Payer: 59 | Admitting: Family Medicine

## 2022-01-28 IMAGING — DX DG LUMBAR SPINE COMPLETE 4+V
5 series · 5 of 5 positions shown · non-contrast
Comparison: None.

CLINICAL DATA: Back pain

EXAM:
LUMBAR SPINE - COMPLETE 4+ VIEW

[l-spine ap]
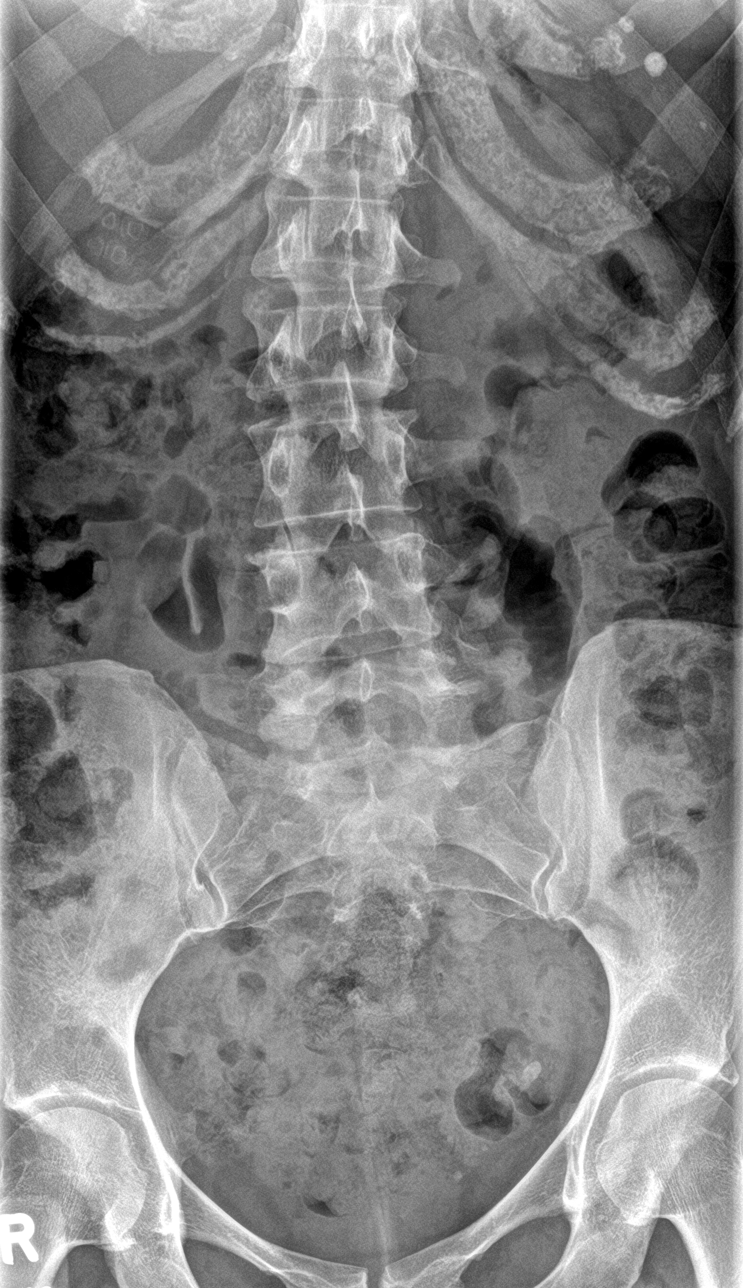

[l-spine obl (1 of 2)]
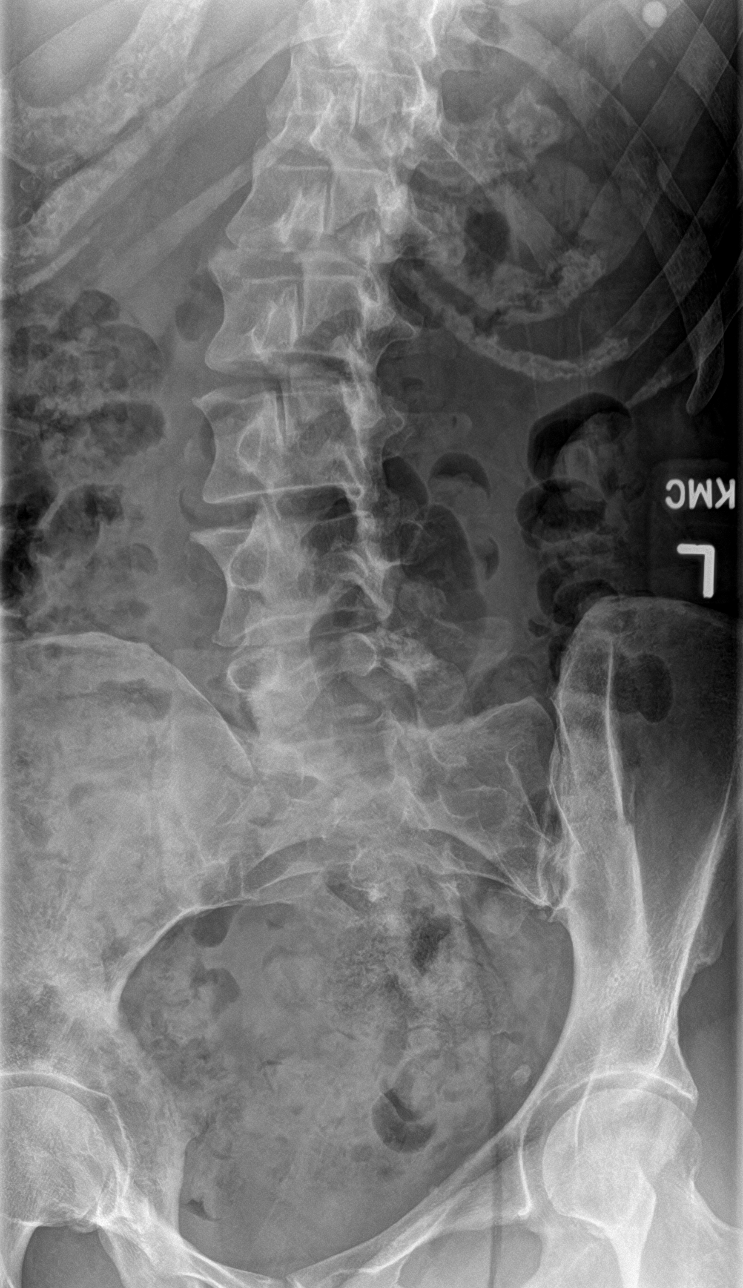

[l-spine obl (2 of 2)]
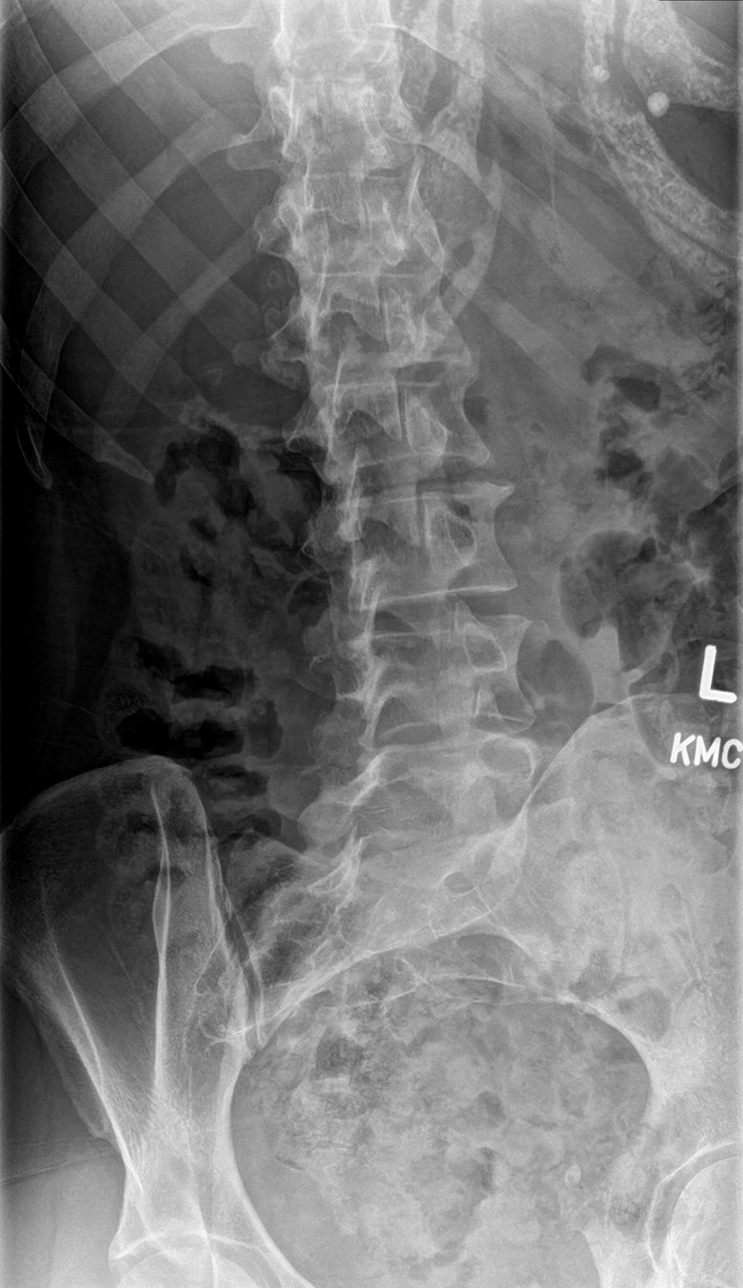

[l-spine lat]
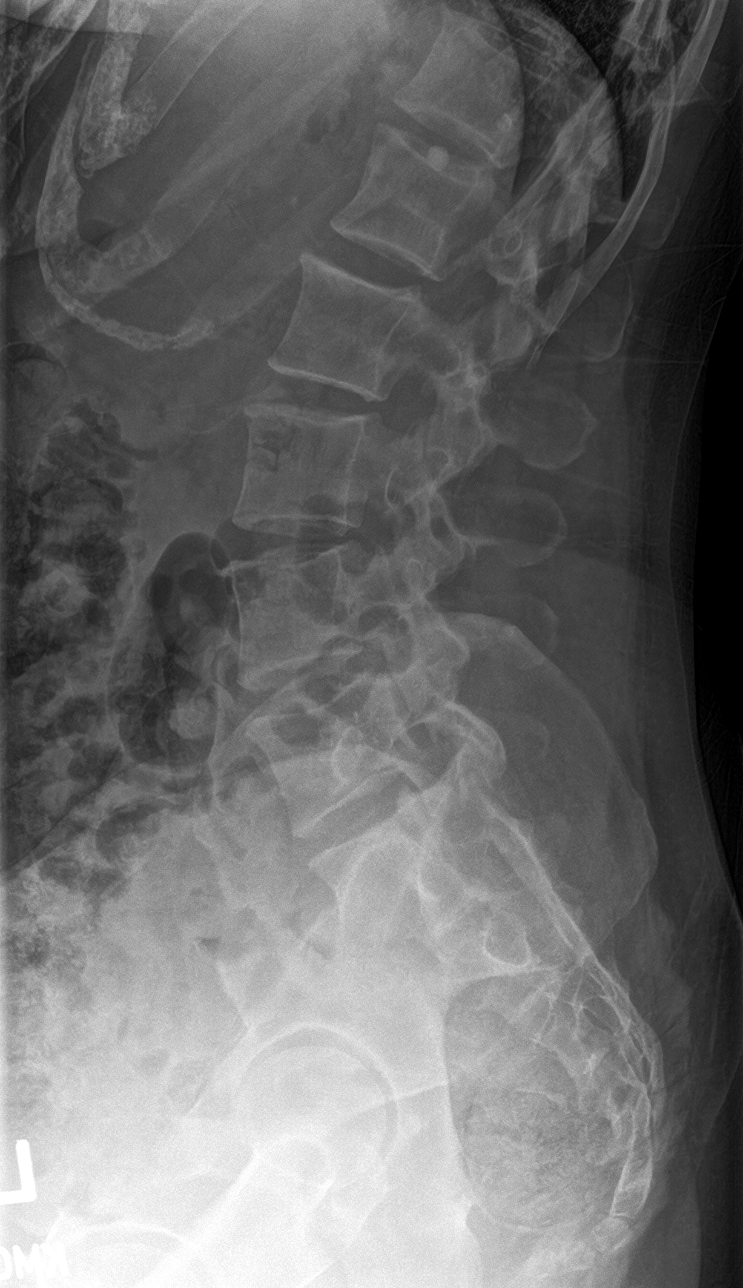

[l-spine spot]
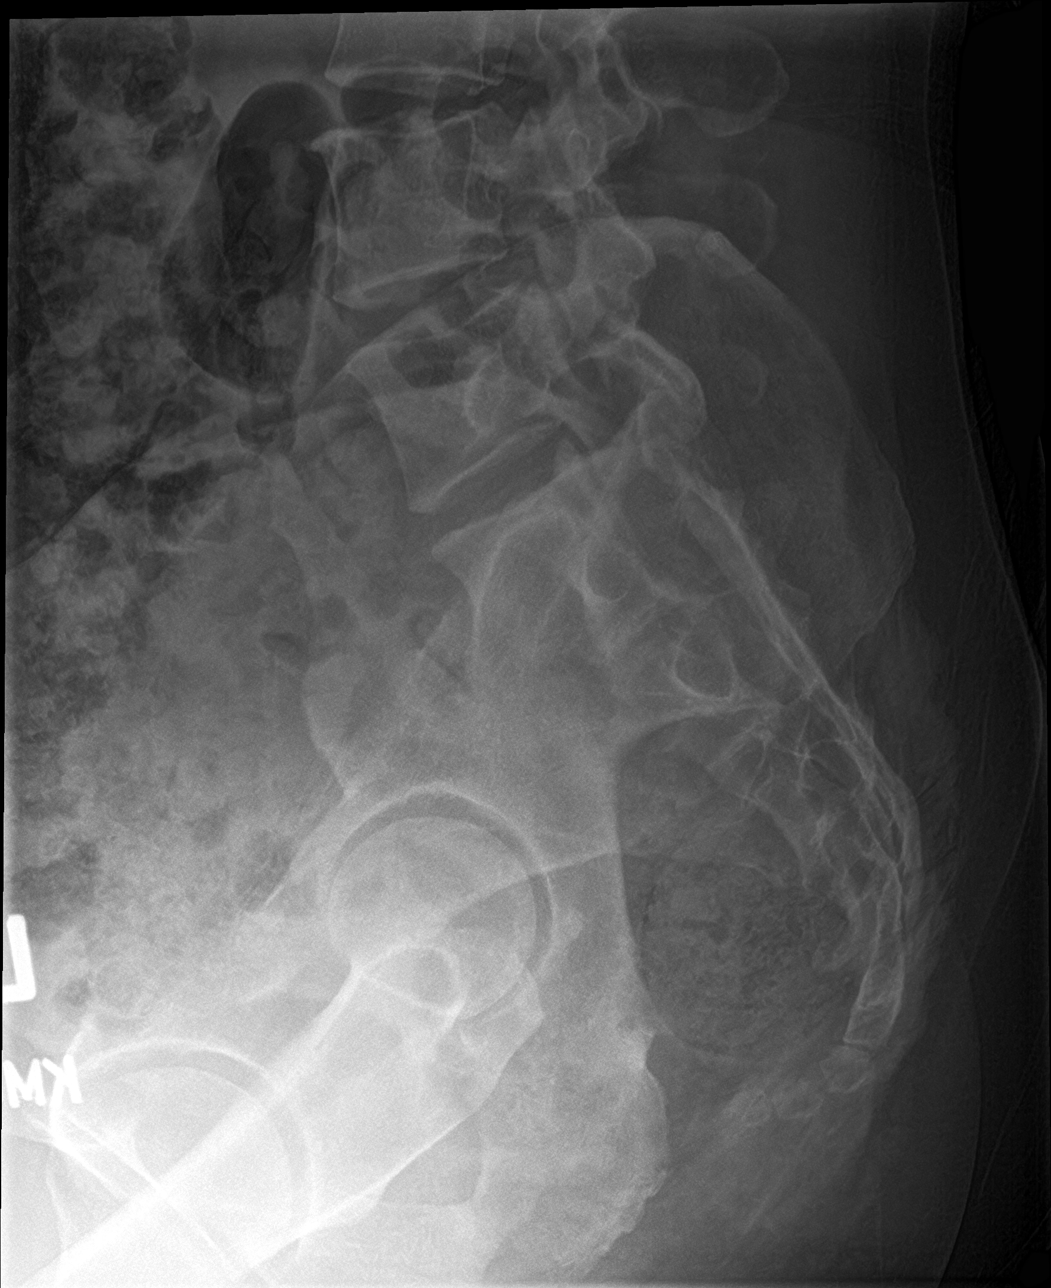

[5 of 5 positions shown; findings below may reference images not displayed]

FINDINGS: Mild dextrocurvature of the lumbar spine. Sagittal alignment within
normal limits. Vertebral body heights and disc spaces are within
normal limits.
IMPRESSION: Mild dextrocurvature of the lumbar spine.  Otherwise negative.

## 2022-03-08 ENCOUNTER — Ambulatory Visit (INDEPENDENT_AMBULATORY_CARE_PROVIDER_SITE_OTHER): Payer: 59 | Admitting: Family Medicine

## 2022-03-08 ENCOUNTER — Encounter: Payer: Self-pay | Admitting: Family Medicine

## 2022-03-08 ENCOUNTER — Other Ambulatory Visit: Payer: Self-pay | Admitting: Family Medicine

## 2022-03-08 VITALS — BP 115/76 | HR 76 | Temp 99.3°F | Ht 67.0 in | Wt 167.4 lb

## 2022-03-08 DIAGNOSIS — G5681 Other specified mononeuropathies of right upper limb: Secondary | ICD-10-CM | POA: Diagnosis not present

## 2022-03-08 MED ORDER — PREDNISONE 10 MG PO TABS: ORAL_TABLET | ORAL | 0 refills | Status: DC

## 2022-03-08 NOTE — Progress Notes (Signed)
Subjective:  Patient ID: Carla Li, female    DOB: 1976/07/04  Age: 46 y.o. MRN: [id number]  CC: Arm Numbness (Right arm, off and on)   HPI Carla Li presents for onset 6 weeks ago of heaviness and tingling. Right arm from shoulder to fingertips is affected.. Also has a pain at the right side near the angle of the scapula. Symptoms were Intermittent at first. Increasing so that it is nearly constant for the last week. Also itching at left anterior shoulder, radiated across chest onto the left breast.      03/08/2022    2:06 PM 12/15/2021    4:09 PM 12/15/2021    4:06 PM  Depression screen PHQ 2/9  Decreased Interest 0 0 0  Down, Depressed, Hopeless 0 0 0  PHQ - 2 Score 0 0 0  Altered sleeping  2   Tired, decreased energy  3   Change in appetite  2   Feeling bad or failure about yourself   0   Trouble concentrating  2   Moving slowly or fidgety/restless  0   Suicidal thoughts  0   PHQ-9 Score  9   Difficult doing work/chores  Somewhat difficult     History Carla Li has a past medical history of Asthma and Diabetes mellitus without complication (HCC).   She has a past surgical history that includes Abdominal hysterectomy and tube placed and removed in ears.   Her family history includes Asthma in her sister; Celiac disease in her mother; Diabetes in her sister; Drug abuse in her brother; Eczema in her sister; Hearing loss in her sister; Heart murmur in her daughter; Hypotension in her mother; Seizures in her brother.She reports that she has quit smoking. She has never used smokeless tobacco. She reports that she does not currently use alcohol. She reports that she does not use drugs.    ROS Review of Systems  Constitutional: Negative.   HENT: Negative.    Eyes:  Negative for visual disturbance.  Respiratory:  Negative for shortness of breath.   Cardiovascular:  Negative for chest pain.  Gastrointestinal:  Negative for abdominal pain.  Musculoskeletal:  Negative for  arthralgias.    Objective:  BP 115/76   Pulse 76   Temp 99.3 F (37.4 C)   Ht 5\' 7"  (1.702 m)   Wt 167 lb 6.4 oz (75.9 kg)   SpO2 97%   BMI 26.22 kg/m   BP Readings from Last 3 Encounters:  03/08/22 115/76  12/15/21 125/63  07/08/21 120/72    Wt Readings from Last 3 Encounters:  03/08/22 167 lb 6.4 oz (75.9 kg)  12/15/21 180 lb 9.6 oz (81.9 kg)  07/08/21 177 lb 6.4 oz (80.5 kg)     Physical Exam Constitutional:      General: She is not in acute distress.    Appearance: She is well-developed.  Cardiovascular:     Rate and Rhythm: Normal rate and regular rhythm.  Pulmonary:     Breath sounds: Normal breath sounds.  Musculoskeletal:        General: Tenderness (at the right lateral margin of the scapular angle) present. No deformity. Normal range of motion.  Skin:    General: Skin is warm and dry.  Neurological:     Mental Status: She is alert and oriented to person, place, and time.       Assessment & Plan:   Levia was seen today for arm numbness.  Diagnoses and all orders for this visit:  Pinched nerve in shoulder, right  Other orders -     predniSONE (DELTASONE) 10 MG tablet; Take 5 daily for 3 days followed by 4,3,2 and 1 for 3 days each.       I have discontinued Maryiah Meyn's tizanidine, diclofenac, AeroChamber Plus, ciprofloxacin-hydrocortisone, and fluticasone furoate-vilanterol. I am also having her start on predniSONE. Additionally, I am having her maintain her ELDERBERRY PO, Multiple Vitamins-Minerals (WOMENS MULTIVITAMIN PO), rOPINIRole, carbidopa-levodopa, desonide, levalbuterol, EPINEPHrine, cetirizine, and montelukast.  Allergies as of 03/08/2022       Reactions   Benadryl [diphenhydramine] Nausea Only   Sulfa Antibiotics Itching, Swelling, Anaphylaxis, Hives   Bactrim [sulfamethoxazole-trimethoprim]    Codeine    REACTION: nausea   Codeine Nausea Only   Penicillins    Pineapple    Sulfonamide Derivatives    REACTION: hives,  itching   Wound Dressing Adhesive Rash   Paper tape causes rash-clear tape okay        Medication List        Accurate as of March 08, 2022  4:43 PM. If you have any questions, ask your nurse or doctor.          STOP taking these medications    AeroChamber Plus inhaler Stopped by: Mechele Claude, MD   ciprofloxacin-hydrocortisone OTIC suspension Commonly known as: CIPRO HC OTIC Stopped by: Mechele Claude, MD   diclofenac 75 MG EC tablet Commonly known as: VOLTAREN Stopped by: Mechele Claude, MD   fluticasone furoate-vilanterol 100-25 MCG/ACT Aepb Commonly known as: Breo Ellipta Stopped by: Mechele Claude, MD   tizanidine 6 MG capsule Commonly known as: ZANAFLEX Stopped by: Mechele Claude, MD       TAKE these medications    carbidopa-levodopa 10-100 MG tablet Commonly known as: Sinemet Take one daily after work for restless legs, and another at bedtime   cetirizine 10 MG tablet Commonly known as: ZYRTEC Take 1 tablet (10 mg total) by mouth daily. For allergy symptoms   desonide 0.05 % ointment Commonly known as: DESOWEN Apply 1 application topically 2 (two) times daily as needed (mild rash flare). Okay to use on the face, neck, groin area. Do not use more than 1 week at a time.   ELDERBERRY PO Take by mouth.   EPINEPHrine 0.3 mg/0.3 mL Soaj injection Commonly known as: EpiPen 2-Pak Inject 0.3 mg into the muscle as needed for anaphylaxis. FOR SEVERE ALLERGIC REACTIONS   levalbuterol 45 MCG/ACT inhaler Commonly known as: Xopenex HFA Inhale 2 puffs into the lungs every 4 (four) hours as needed for wheezing or shortness of breath (coughing fits).   montelukast 10 MG tablet Commonly known as: SINGULAIR TAKE 1 TABLET BY MOUTH EVERY DAY   predniSONE 10 MG tablet Commonly known as: DELTASONE Take 5 daily for 3 days followed by 4,3,2 and 1 for 3 days each. Started by: Mechele Claude, MD   rOPINIRole 2 MG tablet Commonly known as: REQUIP Take 0.5 tablets (1  mg total) by mouth at bedtime.   WOMENS MULTIVITAMIN PO Take by mouth.         Follow-up: Return if symptoms worsen or fail to improve.  Mechele Claude, M.D.

## 2022-05-19 ENCOUNTER — Other Ambulatory Visit (HOSPITAL_BASED_OUTPATIENT_CLINIC_OR_DEPARTMENT_OTHER): Payer: Self-pay

## 2022-05-19 MED ORDER — FLUARIX QUADRIVALENT 0.5 ML IM SUSY
PREFILLED_SYRINGE | INTRAMUSCULAR | 0 refills | Status: DC
  Filled 2022-05-19: qty 0.5, 1d supply, fill #0

## 2022-05-31 ENCOUNTER — Other Ambulatory Visit: Payer: Self-pay | Admitting: Family Medicine

## 2022-06-13 ENCOUNTER — Other Ambulatory Visit: Payer: Self-pay | Admitting: Family Medicine

## 2022-06-16 ENCOUNTER — Ambulatory Visit: Payer: 59 | Admitting: Family Medicine

## 2022-07-07 ENCOUNTER — Ambulatory Visit: Payer: 59 | Admitting: Family Medicine

## 2022-08-06 ENCOUNTER — Other Ambulatory Visit: Payer: Self-pay | Admitting: Family Medicine

## 2022-08-18 ENCOUNTER — Ambulatory Visit (INDEPENDENT_AMBULATORY_CARE_PROVIDER_SITE_OTHER): Payer: 59 | Admitting: Family Medicine

## 2022-08-18 ENCOUNTER — Encounter: Payer: Self-pay | Admitting: Family Medicine

## 2022-08-18 VITALS — BP 110/63 | HR 70 | Temp 97.6°F | Ht 67.0 in | Wt 167.2 lb

## 2022-08-18 DIAGNOSIS — R34 Anuria and oliguria: Secondary | ICD-10-CM | POA: Diagnosis not present

## 2022-08-18 DIAGNOSIS — D508 Other iron deficiency anemias: Secondary | ICD-10-CM

## 2022-08-18 DIAGNOSIS — R7309 Other abnormal glucose: Secondary | ICD-10-CM | POA: Diagnosis not present

## 2022-08-18 DIAGNOSIS — Z13220 Encounter for screening for lipoid disorders: Secondary | ICD-10-CM | POA: Diagnosis not present

## 2022-08-18 LAB — URINALYSIS
Bilirubin, UA: NEGATIVE
Glucose, UA: NEGATIVE
Ketones, UA: NEGATIVE
Nitrite, UA: NEGATIVE
Protein,UA: NEGATIVE
Specific Gravity, UA: 1.025 (ref 1.005–1.030)
Urobilinogen, Ur: 0.2 mg/dL (ref 0.2–1.0)
pH, UA: 5.5 (ref 5.0–7.5)

## 2022-08-18 LAB — BAYER DCA HB A1C WAIVED: HB A1C (BAYER DCA - WAIVED): 5.7 % — ABNORMAL HIGH (ref 4.8–5.6)

## 2022-08-18 MED ORDER — ROPINIROLE HCL 2 MG PO TABS: 1.0000 mg | ORAL_TABLET | Freq: Every day | ORAL | 3 refills | Status: DC

## 2022-08-18 MED ORDER — CARBIDOPA-LEVODOPA 10-100 MG PO TABS: ORAL_TABLET | ORAL | 0 refills | Status: DC

## 2022-08-18 MED ORDER — FUROSEMIDE 20 MG PO TABS: 10.0000 mg | ORAL_TABLET | Freq: Every day | ORAL | 3 refills | Status: DC

## 2022-08-18 NOTE — Progress Notes (Signed)
[id number]  Subjective:  Patient ID: Carla Li, female    DOB: [birth year]  Age: 47 y.o. MRN: [id number]  CC: Medical Management of Chronic Issues   HPI Carla Li presents for restless legs well controlled using sinemet and ropinirole. Doesn't need inhaler. 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 Requests fluid pill to help. Taking her husband's furosemide, 10 mg.  No polydipsia or polyuria. Has hx of elevated glucose and prediabetes. On no tx for that.      08/18/2022    1:05 PM 08/18/2022   12:58 PM 03/08/2022    2:06 PM  Depression screen PHQ 2/9  Decreased Interest 0 0 0  Down, Depressed, Hopeless 0 0 0  PHQ - 2 Score 0 0 0  Altered sleeping 2    Tired, decreased energy 3    Change in appetite 0    Feeling bad or failure about yourself  0    Trouble concentrating 0    Moving slowly or fidgety/restless 0    Suicidal thoughts 0    PHQ-9 Score 5    Difficult doing work/chores Not difficult at all      History Carla Li has a past medical history of Asthma and Diabetes mellitus without complication ([place]).   She has a past surgical history that includes Abdominal hysterectomy and tube placed and removed in ears.   Her family history includes Asthma in her sister; Celiac disease in her mother; Diabetes in her sister; Drug abuse in her brother; Eczema in her sister; Hearing loss in her sister; Heart murmur in her daughter; Hypotension in her mother; Seizures in her brother.She reports that she has quit smoking. She has never used smokeless tobacco. She reports that she does not currently use alcohol. She reports that she does not use drugs.    ROS Review of Systems  Constitutional: Negative.   HENT: Negative.    Eyes:  Negative for visual disturbance.  Respiratory:  Negative for shortness of breath.   Cardiovascular:  Negative for chest pain.  Gastrointestinal:  Negative for abdominal pain.  Musculoskeletal:   Negative for arthralgias.    Objective:  BP 110/63   Pulse 70   Temp 97.6 F (36.4 C)   Ht 5\' 7"  (1.702 m)   Wt 167 lb 3.2 oz (75.8 kg)   SpO2 100%   BMI 26.19 kg/m   BP Readings from Last 3 Encounters:  08/18/22 110/63  03/08/22 115/76  12/15/21 125/63    Wt Readings from Last 3 Encounters:  08/18/22 167 lb 3.2 oz (75.8 kg)  03/08/22 167 lb 6.4 oz (75.9 kg)  12/15/21 180 lb 9.6 oz (81.9 kg)     Physical Exam Constitutional:      General: She is not in acute distress.    Appearance: She is well-developed.  Cardiovascular:     Rate and Rhythm: Normal rate and regular rhythm.  Pulmonary:     Breath sounds: Normal breath sounds.  Musculoskeletal:        General: Normal range of motion.  Skin:    General: Skin is warm and dry.  Neurological:     Mental Status: She is alert and oriented to person, place, and time.       Assessment & Plan:   Carla Li was seen today for medical management of chronic issues.  Diagnoses and all orders for this visit:  Other abnormal glucose -     Bayer DCA Hb A1c Waived -  CMP14+EGFR -     Urinalysis  Other iron deficiency anemia -     CBC with Differential/Platelet -     Anemia Profile B -     Urinalysis  Lipid screening -     Lipid panel -     Urinalysis  Oliguria  Other orders -     rOPINIRole (REQUIP) 2 MG tablet; Take 0.5 tablets (1 mg total) by mouth at bedtime. -     furosemide (LASIX) 20 MG tablet; Take 0.5 tablets (10 mg total) by mouth daily. As needed for fluid -     carbidopa-levodopa (SINEMET IR) 10-100 MG tablet; TAKE ONE TABLET DAILY AFTER WORK FOR RESTLESS LEGS       I have discontinued Carla Li's predniSONE and Fluarix Quadrivalent. I am also having her start on furosemide. Additionally, I am having her maintain her ELDERBERRY PO, Multiple Vitamins-Minerals (WOMENS MULTIVITAMIN PO), desonide, levalbuterol, EPINEPHrine, cetirizine, montelukast, rOPINIRole, and carbidopa-levodopa.  Allergies  as of 08/18/2022       Reactions   Benadryl [diphenhydramine] Nausea Only   Sulfa Antibiotics Itching, Swelling, Anaphylaxis, Hives   Bactrim [sulfamethoxazole-trimethoprim]    Codeine    REACTION: nausea   Codeine Nausea Only   Penicillins    Pineapple    Sulfonamide Derivatives    REACTION: hives, itching   Wound Dressing Adhesive Rash   Paper tape causes rash-clear tape okay        Medication List        Accurate as of August 18, 2022  2:58 PM. If you have any questions, ask your nurse or doctor.          STOP taking these medications    Fluarix Quadrivalent 0.5 ML injection Generic drug: influenza vac split quadrivalent PF Stopped by: Claretta Fraise, MD   predniSONE 10 MG tablet Commonly known as: DELTASONE Stopped by: Claretta Fraise, MD       TAKE these medications    carbidopa-levodopa 10-100 MG tablet Commonly known as: SINEMET IR TAKE ONE TABLET DAILY AFTER WORK FOR RESTLESS LEGS   cetirizine 10 MG tablet Commonly known as: ZYRTEC Take 1 tablet (10 mg total) by mouth daily. For allergy symptoms   desonide 0.05 % ointment Commonly known as: DESOWEN Apply 1 application topically 2 (two) times daily as needed (mild rash flare). Okay to use on the face, neck, groin area. Do not use more than 1 week at a time.   ELDERBERRY PO Take by mouth.   EPINEPHrine 0.3 mg/0.3 mL Soaj injection Commonly known as: EpiPen 2-Pak Inject 0.3 mg into the muscle as needed for anaphylaxis. FOR SEVERE ALLERGIC REACTIONS   furosemide 20 MG tablet Commonly known as: LASIX Take 0.5 tablets (10 mg total) by mouth daily. As needed for fluid Started by: Claretta Fraise, MD   levalbuterol 45 MCG/ACT inhaler Commonly known as: Xopenex HFA Inhale 2 puffs into the lungs every 4 (four) hours as needed for wheezing or shortness of breath (coughing fits).   montelukast 10 MG tablet Commonly known as: SINGULAIR TAKE 1 TABLET BY MOUTH EVERY DAY   rOPINIRole 2 MG  tablet Commonly known as: REQUIP Take 0.5 tablets (1 mg total) by mouth at bedtime.   WOMENS MULTIVITAMIN PO Take by mouth.         Follow-up: Return in about 6 months (around 02/16/2023).  Claretta Fraise, M.D.

## 2022-08-19 LAB — ANEMIA PROFILE B
Basophils Absolute: 0.1 10*3/uL (ref 0.0–0.2)
Basos: 1 %
EOS (ABSOLUTE): 0.1 10*3/uL (ref 0.0–0.4)
Eos: 2 %
Ferritin: 25 ng/mL (ref 15–150)
Folate: 11.3 ng/mL (ref >3.0)
Hematocrit: 41.1 % (ref 34.0–46.6)
Hemoglobin: 13.2 g/dL (ref 11.1–15.9)
Immature Grans (Abs): 0 10*3/uL (ref 0.0–0.1)
Immature Granulocytes: 0 %
Iron Saturation: 30 % (ref 15–55)
Iron: 103 ug/dL (ref 27–159)
Lymphocytes Absolute: 2.1 10*3/uL (ref 0.7–3.1)
Lymphs: 37 %
MCH: 27.1 pg (ref 26.6–33.0)
MCHC: 32.1 g/dL (ref 31.5–35.7)
MCV: 84 fL (ref 79–97)
Monocytes Absolute: 0.7 10*3/uL (ref 0.1–0.9)
Monocytes: 12 %
Neutrophils Absolute: 2.7 10*3/uL (ref 1.4–7.0)
Neutrophils: 48 %
Platelets: 267 10*3/uL (ref 150–450)
RBC: 4.87 x10E6/uL (ref 3.77–5.28)
RDW: 13.1 % (ref 11.7–15.4)
Retic Ct Pct: 0.7 % (ref 0.6–2.6)
Total Iron Binding Capacity: 349 ug/dL (ref 250–450)
UIBC: 246 ug/dL (ref 131–425)
Vitamin B-12: 593 pg/mL (ref 232–1245)
WBC: 5.6 10*3/uL (ref 3.4–10.8)

## 2022-08-19 LAB — LIPID PANEL
Chol/HDL Ratio: 3 ratio (ref 0.0–4.4)
Cholesterol, Total: 176 mg/dL (ref 100–199)
HDL: 58 mg/dL (ref >39)
LDL Chol Calc (NIH): 104 mg/dL — ABNORMAL HIGH (ref 0–99)
Triglycerides: 74 mg/dL (ref 0–149)
VLDL Cholesterol Cal: 14 mg/dL (ref 5–40)

## 2022-08-19 LAB — CMP14+EGFR
ALT: 14 IU/L (ref 0–32)
AST: 18 IU/L (ref 0–40)
Albumin/Globulin Ratio: 1.7 (ref 1.2–2.2)
Albumin: 4.5 g/dL (ref 3.9–4.9)
Alkaline Phosphatase: 79 IU/L (ref 44–121)
BUN/Creatinine Ratio: 14 (ref 9–23)
BUN: 10 mg/dL (ref 6–24)
Bilirubin Total: 0.5 mg/dL (ref 0.0–1.2)
CO2: 24 mmol/L (ref 20–29)
Calcium: 9.6 mg/dL (ref 8.7–10.2)
Chloride: 102 mmol/L (ref 96–106)
Creatinine, Ser: 0.74 mg/dL (ref 0.57–1.00)
Globulin, Total: 2.7 g/dL (ref 1.5–4.5)
Glucose: 91 mg/dL (ref 70–99)
Potassium: 4.3 mmol/L (ref 3.5–5.2)
Sodium: 140 mmol/L (ref 134–144)
Total Protein: 7.2 g/dL (ref 6.0–8.5)
eGFR: 102 mL/min/{1.73_m2} (ref >59)

## 2022-08-22 NOTE — Progress Notes (Signed)
Hello [name],  Your lab result is normal and/or stable.Some minor variations that are not significant are commonly marked abnormal, but do not represent any medical problem for you.  Best regards, Claretta Fraise, M.D.

## 2022-09-12 ENCOUNTER — Encounter: Payer: Self-pay | Admitting: Family Medicine

## 2022-10-10 ENCOUNTER — Other Ambulatory Visit: Payer: Self-pay | Admitting: Family Medicine

## 2022-10-11 ENCOUNTER — Telehealth: Payer: Commercial Managed Care - PPO | Admitting: Physician Assistant

## 2022-10-11 DIAGNOSIS — L709 Acne, unspecified: Secondary | ICD-10-CM | POA: Diagnosis not present

## 2022-10-11 MED ORDER — BENZOYL PEROXIDE-ERYTHROMYCIN 5-3 % EX GEL: Freq: Two times a day (BID) | CUTANEOUS | 0 refills | Status: DC

## 2022-10-11 NOTE — Progress Notes (Signed)
We are sorry that you are experiencing this issue.  Here is how we plan to help!  Based on what you shared with me it looks like you have uncomplicated acne.  Acne is a disorder of the hair follicles and oil glands (sebaceous glands). The sebaceous glands secrete oils to keep the skin moist.  When the glands get clogged, it can lead to pimples or cysts.  These cysts may become infected and leave scars. Acne is very common and normally occurs at puberty.  Acne is also inherited.  Your personal care plan consists of the following recommendations:  I recommend that you use a daily cleanser  You might try 2% topical salicylic acid pads or wipes.  Use the pads to daily cleanse your skin.  I have prescribed a topical gel with an antibiotic:  Benzoyl peroxide-erythromycin gel.  This gel should be applied to the affected areas twice a day. Be sure to read the package insert for potential side effects.  If excessive dryness or peeling occurs, reduce dose frequency or concentration of the topical scrubs.  If excessive stinging or burning occurs, remove the topical gel with mild soap and water and resume at a lower dose the next day.  Remember oral antibiotics and topical acne treatments may increase your sensitivity to the sun!  HOME CARE: Do not squeeze pimples because that can often lead to infections, worse acne, and scars. Use a moisturizer that contains retinoid or fruit acids that may inhibit the development of new acne lesions. Although there is not a clear link that foods can cause acne, doctors do believe that too many sweets predispose you to skin problems.  GET HELP RIGHT AWAY IF: If your acne gets worse or is not better within 10 days. If you become depressed. If you become pregnant, discontinue medications and call your OB/GYN.  MAKE SURE YOU: Understand these instructions. Will watch your condition. Will get help right away if you are not doing well or get worse.  Thank you for  choosing an e-visit.  Your e-visit answers were reviewed by a board certified advanced clinical practitioner to complete your personal care plan. Depending upon the condition, your plan could have included both over the counter or prescription medications.  Please review your pharmacy choice. Make sure the pharmacy is open so you can pick up prescription now. If there is a problem, you may contact your provider through [employer] and have the prescription routed to another pharmacy.  Your safety is important to [place]. If you have drug allergies check your prescription carefully.   For the next 24 hours you can use MyChart to ask questions about today's visit, request a non-urgent call back, or ask for a work or school excuse. You will get an email in the next two days asking about your experience. I hope that your e-visit has been valuable and will speed your recovery.   I have spent 5 minutes in review of e-visit questionnaire, review and updating patient chart, medical decision making and response to patient.   Lenise Arena Ward, PA-C

## 2023-02-14 DIAGNOSIS — H9193 Unspecified hearing loss, bilateral: Secondary | ICD-10-CM | POA: Diagnosis not present

## 2023-02-14 DIAGNOSIS — H60543 Acute eczematoid otitis externa, bilateral: Secondary | ICD-10-CM | POA: Diagnosis not present

## 2023-02-14 DIAGNOSIS — Z974 Presence of external hearing-aid: Secondary | ICD-10-CM | POA: Diagnosis not present

## 2023-02-16 ENCOUNTER — Encounter: Payer: Self-pay | Admitting: Family Medicine

## 2023-02-16 ENCOUNTER — Ambulatory Visit (INDEPENDENT_AMBULATORY_CARE_PROVIDER_SITE_OTHER): Payer: Commercial Managed Care - PPO | Admitting: Family Medicine

## 2023-02-16 VITALS — BP 111/70 | HR 78 | Temp 97.9°F | Ht 67.0 in | Wt 169.8 lb

## 2023-02-16 DIAGNOSIS — R7309 Other abnormal glucose: Secondary | ICD-10-CM

## 2023-02-16 DIAGNOSIS — Z13220 Encounter for screening for lipoid disorders: Secondary | ICD-10-CM

## 2023-02-16 DIAGNOSIS — D508 Other iron deficiency anemias: Secondary | ICD-10-CM | POA: Diagnosis not present

## 2023-02-16 LAB — BAYER DCA HB A1C WAIVED: HB A1C (BAYER DCA - WAIVED): 5.6 % (ref 4.8–5.6)

## 2023-02-16 MED ORDER — POTASSIUM CHLORIDE CRYS ER 10 MEQ PO TBCR: 10.0000 meq | EXTENDED_RELEASE_TABLET | Freq: Every day | ORAL | 5 refills | Status: DC

## 2023-02-16 MED ORDER — FUROSEMIDE 20 MG PO TABS: 20.0000 mg | ORAL_TABLET | Freq: Every day | ORAL | 5 refills | Status: DC

## 2023-02-16 NOTE — Progress Notes (Signed)
Subjective:  Patient ID: Carla Li, female    DOB: [birth year]  Age: 47 y.o. MRN: [id number]  CC: Medical Management of Chronic Issues   HPI [place] Severs presents for marital discord. They were seeing a marriage counselor, but he backed out after a couple of months. She says she wants more sexuality, but he has no libido. He wants her to stay away from her ex, but she has kids and a grandchild they share, so situations come up at times. She cans food, that is the only activity they do together. He seems content with that. She is not.   Prediabetes check today . Following a low carb diet. Having to increase lasix to a whole pill on days she works. Currently no potassium supplement.      02/16/2023    3:43 PM 02/16/2023    3:40 PM 08/18/2022    1:05 PM  Depression screen PHQ 2/9  Decreased Interest 1 0 0  Down, Depressed, Hopeless 0 0 0  PHQ - 2 Score 1 0 0  Altered sleeping 2  2  Tired, decreased energy 2  3  Change in appetite 2  0  Feeling bad or failure about yourself  0  0  Trouble concentrating 0  0  Moving slowly or fidgety/restless 0  0  Suicidal thoughts 0  0  PHQ-9 Score 7  5  Difficult doing work/chores Not difficult at all  Not difficult at all    History Cardelia has a past medical history of Asthma and Diabetes mellitus without complication (HCC).   She has a past surgical history that includes Abdominal hysterectomy and tube placed and removed in ears.   Her family history includes Asthma in her sister; Celiac disease in her mother; Diabetes in her sister; Drug abuse in her brother; Eczema in her sister; Hearing loss in her sister; Heart murmur in her daughter; Hypotension in her mother; Seizures in her brother.She reports that she has quit smoking. She has never used smokeless tobacco. She reports that she does not currently use alcohol. She reports that she does not use drugs.    ROS Review of Systems  Constitutional: Negative.   HENT: Negative.    Eyes:   Negative for visual disturbance.  Respiratory:  Negative for shortness of breath.   Cardiovascular:  Negative for chest pain.  Gastrointestinal:  Negative for abdominal pain.  Musculoskeletal:  Negative for arthralgias.    Objective:  BP 111/70   Pulse 78   Temp 97.9 F (36.6 C)   Ht 5\' 7"  (1.702 m)   Wt 169 lb 12.8 oz (77 kg)   SpO2 96%   BMI 26.59 kg/m   BP Readings from Last 3 Encounters:  02/16/23 111/70  08/18/22 110/63  03/08/22 115/76    Wt Readings from Last 3 Encounters:  02/16/23 169 lb 12.8 oz (77 kg)  08/18/22 167 lb 3.2 oz (75.8 kg)  03/08/22 167 lb 6.4 oz (75.9 kg)     Physical Exam Constitutional:      General: She is not in acute distress.    Appearance: She is well-developed.  Cardiovascular:     Rate and Rhythm: Normal rate and regular rhythm.  Pulmonary:     Breath sounds: Normal breath sounds.  Musculoskeletal:        General: Normal range of motion.  Skin:    General: Skin is warm and dry.  Neurological:     Mental Status: She is alert and oriented to person, place,  and time.       Assessment & Plan:   Jammi was seen today for medical management of chronic issues.  Diagnoses and all orders for this visit:  DIABETES MELLITUS, BORDERLINE -     Bayer DCA Hb A1c Waived -     CBC with Differential/Platelet -     CMP14+EGFR  Other iron deficiency anemia -     CBC with Differential/Platelet -     Anemia Profile B  Lipid screening -     Lipid panel  Other orders -     potassium chloride SA (KLOR-CON M) 10 MEQ tablet; Take 1 tablet (10 mEq total) by mouth daily. As a potassium supplement -     furosemide (LASIX) 20 MG tablet; Take 1 tablet (20 mg total) by mouth daily. As needed for fluid       I have discontinued Miette Ellefson's ELDERBERRY PO. I have also changed her furosemide. Additionally, I am having her start on potassium chloride. Lastly, I am having her maintain her Multiple Vitamins-Minerals (WOMENS MULTIVITAMIN PO),  desonide, levalbuterol, EPINEPHrine, cetirizine, rOPINIRole, montelukast, carbidopa-levodopa, and benzoyl peroxide-erythromycin.  Allergies as of 02/16/2023       Reactions   Benadryl [diphenhydramine] Nausea Only   Sulfa Antibiotics Itching, Swelling, Anaphylaxis, Hives   Bactrim [sulfamethoxazole-trimethoprim]    Codeine    REACTION: nausea   Codeine Nausea Only   Penicillins    Pineapple    Sulfonamide Derivatives    REACTION: hives, itching   Wound Dressing Adhesive Rash   Paper tape causes rash-clear tape okay        Medication List        Accurate as of February 16, 2023 11:59 PM. If you have any questions, ask your nurse or doctor.          STOP taking these medications    ELDERBERRY PO Stopped by: Marleigh Kaylor       TAKE these medications    benzoyl peroxide-erythromycin gel Commonly known as: BENZAMYCIN Apply topically 2 (two) times daily.   carbidopa-levodopa 10-100 MG tablet Commonly known as: SINEMET IR TAKE ONE TABLET DAILY AFTER WORK FOR RESTLESS LEGS   cetirizine 10 MG tablet Commonly known as: ZYRTEC Take 1 tablet (10 mg total) by mouth daily. For allergy symptoms   desonide 0.05 % ointment Commonly known as: DESOWEN Apply 1 application topically 2 (two) times daily as needed (mild rash flare). Okay to use on the face, neck, groin area. Do not use more than 1 week at a time.   EPINEPHrine 0.3 mg/0.3 mL Soaj injection Commonly known as: EpiPen 2-Pak Inject 0.3 mg into the muscle as needed for anaphylaxis. FOR SEVERE ALLERGIC REACTIONS   furosemide 20 MG tablet Commonly known as: LASIX Take 1 tablet (20 mg total) by mouth daily. As needed for fluid What changed: how much to take Changed by: Raynard Mapps   levalbuterol 45 MCG/ACT inhaler Commonly known as: Xopenex HFA Inhale 2 puffs into the lungs every 4 (four) hours as needed for wheezing or shortness of breath (coughing fits).   montelukast 10 MG tablet Commonly known as:  SINGULAIR TAKE 1 TABLET BY MOUTH EVERY DAY   potassium chloride 10 MEQ tablet Commonly known as: KLOR-CON M Take 1 tablet (10 mEq total) by mouth daily. As a potassium supplement Started by: Sarath Privott   rOPINIRole 2 MG tablet Commonly known as: REQUIP Take 0.5 tablets (1 mg total) by mouth at bedtime.   WOMENS MULTIVITAMIN PO Take by mouth.  Follow-up: No follow-ups on file.  Mechele Claude, M.D.

## 2023-02-17 LAB — CMP14+EGFR
ALT: 20 IU/L (ref 0–32)
AST: 22 IU/L (ref 0–40)
Albumin: 4.4 g/dL (ref 3.9–4.9)
Alkaline Phosphatase: 89 IU/L (ref 44–121)
BUN/Creatinine Ratio: 15 (ref 9–23)
BUN: 11 mg/dL (ref 6–24)
Bilirubin Total: 0.3 mg/dL (ref 0.0–1.2)
CO2: 23 mmol/L (ref 20–29)
Calcium: 9.6 mg/dL (ref 8.7–10.2)
Chloride: 101 mmol/L (ref 96–106)
Creatinine, Ser: 0.74 mg/dL (ref 0.57–1.00)
Globulin, Total: 2.9 g/dL (ref 1.5–4.5)
Glucose: 93 mg/dL (ref 70–99)
Potassium: 4.2 mmol/L (ref 3.5–5.2)
Sodium: 138 mmol/L (ref 134–144)
Total Protein: 7.3 g/dL (ref 6.0–8.5)
eGFR: 102 mL/min/{1.73_m2} (ref >59)

## 2023-02-17 LAB — LIPID PANEL
Chol/HDL Ratio: 3.4 ratio (ref 0.0–4.4)
Cholesterol, Total: 188 mg/dL (ref 100–199)
HDL: 56 mg/dL (ref >39)
LDL Chol Calc (NIH): 116 mg/dL — ABNORMAL HIGH (ref 0–99)
Triglycerides: 85 mg/dL (ref 0–149)
VLDL Cholesterol Cal: 16 mg/dL (ref 5–40)

## 2023-02-17 LAB — ANEMIA PROFILE B
Basophils Absolute: 0.1 10*3/uL (ref 0.0–0.2)
Basos: 1 %
EOS (ABSOLUTE): 0.1 10*3/uL (ref 0.0–0.4)
Eos: 2 %
Ferritin: 26 ng/mL (ref 15–150)
Folate: 12 ng/mL (ref >3.0)
Hematocrit: 40.3 % (ref 34.0–46.6)
Hemoglobin: 13.2 g/dL (ref 11.1–15.9)
Immature Grans (Abs): 0 10*3/uL (ref 0.0–0.1)
Immature Granulocytes: 0 %
Iron Saturation: 14 % — ABNORMAL LOW (ref 15–55)
Iron: 47 ug/dL (ref 27–159)
Lymphocytes Absolute: 2.7 10*3/uL (ref 0.7–3.1)
Lymphs: 31 %
MCH: 27.8 pg (ref 26.6–33.0)
MCHC: 32.8 g/dL (ref 31.5–35.7)
MCV: 85 fL (ref 79–97)
Monocytes Absolute: 1 10*3/uL — ABNORMAL HIGH (ref 0.1–0.9)
Monocytes: 12 %
Neutrophils Absolute: 4.7 10*3/uL (ref 1.4–7.0)
Neutrophils: 54 %
Platelets: 278 10*3/uL (ref 150–450)
RBC: 4.75 x10E6/uL (ref 3.77–5.28)
RDW: 12.7 % (ref 11.7–15.4)
Retic Ct Pct: 0.8 % (ref 0.6–2.6)
Total Iron Binding Capacity: 326 ug/dL (ref 250–450)
UIBC: 279 ug/dL (ref 131–425)
Vitamin B-12: 654 pg/mL (ref 232–1245)
WBC: 8.6 10*3/uL (ref 3.4–10.8)

## 2023-02-18 NOTE — Progress Notes (Signed)
Hello Day,  Your lab result is normal and/or stable.Some minor variations that are not significant are commonly marked abnormal, but do not represent any medical problem for you.  Best regards, Benaiah Behan, M.D.

## 2023-03-01 DIAGNOSIS — H90A32 Mixed conductive and sensorineural hearing loss, unilateral, left ear with restricted hearing on the contralateral side: Secondary | ICD-10-CM | POA: Diagnosis not present

## 2023-03-01 DIAGNOSIS — Z0110 Encounter for examination of ears and hearing without abnormal findings: Secondary | ICD-10-CM | POA: Diagnosis not present

## 2023-03-01 DIAGNOSIS — H903 Sensorineural hearing loss, bilateral: Secondary | ICD-10-CM | POA: Diagnosis not present

## 2023-03-01 DIAGNOSIS — Z880 Allergy status to penicillin: Secondary | ICD-10-CM | POA: Diagnosis not present

## 2023-03-01 DIAGNOSIS — H60543 Acute eczematoid otitis externa, bilateral: Secondary | ICD-10-CM | POA: Diagnosis not present

## 2023-03-01 DIAGNOSIS — Z885 Allergy status to narcotic agent status: Secondary | ICD-10-CM | POA: Diagnosis not present

## 2023-03-01 DIAGNOSIS — Z882 Allergy status to sulfonamides status: Secondary | ICD-10-CM | POA: Diagnosis not present

## 2023-03-09 DIAGNOSIS — M069 Rheumatoid arthritis, unspecified: Secondary | ICD-10-CM

## 2023-03-09 HISTORY — DX: Rheumatoid arthritis, unspecified: M06.9

## 2023-03-16 DIAGNOSIS — H906 Mixed conductive and sensorineural hearing loss, bilateral: Secondary | ICD-10-CM | POA: Diagnosis not present

## 2023-03-16 DIAGNOSIS — H6063 Unspecified chronic otitis externa, bilateral: Secondary | ICD-10-CM | POA: Diagnosis not present

## 2023-03-16 DIAGNOSIS — H60543 Acute eczematoid otitis externa, bilateral: Secondary | ICD-10-CM | POA: Diagnosis not present

## 2023-03-16 DIAGNOSIS — H9212 Otorrhea, left ear: Secondary | ICD-10-CM | POA: Diagnosis not present

## 2023-03-22 DIAGNOSIS — L4050 Arthropathic psoriasis, unspecified: Secondary | ICD-10-CM | POA: Diagnosis not present

## 2023-03-22 DIAGNOSIS — L409 Psoriasis, unspecified: Secondary | ICD-10-CM | POA: Diagnosis not present

## 2023-03-22 DIAGNOSIS — L4059 Other psoriatic arthropathy: Secondary | ICD-10-CM | POA: Diagnosis not present

## 2023-03-22 DIAGNOSIS — Z974 Presence of external hearing-aid: Secondary | ICD-10-CM | POA: Diagnosis not present

## 2023-03-22 DIAGNOSIS — H906 Mixed conductive and sensorineural hearing loss, bilateral: Secondary | ICD-10-CM | POA: Diagnosis not present

## 2023-03-22 DIAGNOSIS — R9389 Abnormal findings on diagnostic imaging of other specified body structures: Secondary | ICD-10-CM | POA: Diagnosis not present

## 2023-03-23 ENCOUNTER — Telehealth: Payer: Self-pay | Admitting: Family Medicine

## 2023-03-23 ENCOUNTER — Other Ambulatory Visit: Payer: Self-pay | Admitting: Family Medicine

## 2023-03-23 MED ORDER — CARBIDOPA-LEVODOPA 25-100 MG PO TABS: 1.0000 | ORAL_TABLET | Freq: Three times a day (TID) | ORAL | 2 refills | Status: DC

## 2023-03-23 NOTE — Telephone Encounter (Signed)
Pt says when she was seen by Dr Darlyn Read last month, he was supposed to change her CARBIDOPA Rx to taking 1.5 pills daily and send the new Rx to [facility] pharmacy but [facility] is still waiting on the new Rx. Please send.

## 2023-03-23 NOTE — Telephone Encounter (Signed)
I increased the strength the equivalent f 1.5 pills by ordering a stronger version.

## 2023-03-23 NOTE — Telephone Encounter (Signed)
Patient aware.

## 2023-03-28 DIAGNOSIS — Z974 Presence of external hearing-aid: Secondary | ICD-10-CM | POA: Diagnosis not present

## 2023-03-28 DIAGNOSIS — H9212 Otorrhea, left ear: Secondary | ICD-10-CM | POA: Diagnosis not present

## 2023-03-28 DIAGNOSIS — H90A32 Mixed conductive and sensorineural hearing loss, unilateral, left ear with restricted hearing on the contralateral side: Secondary | ICD-10-CM | POA: Diagnosis not present

## 2023-03-28 DIAGNOSIS — L4050 Arthropathic psoriasis, unspecified: Secondary | ICD-10-CM | POA: Diagnosis not present

## 2023-04-20 ENCOUNTER — Encounter: Payer: Self-pay | Admitting: Family Medicine

## 2023-05-03 ENCOUNTER — Telehealth: Payer: Commercial Managed Care - PPO | Admitting: Family Medicine

## 2023-05-03 DIAGNOSIS — H109 Unspecified conjunctivitis: Secondary | ICD-10-CM | POA: Diagnosis not present

## 2023-05-03 MED ORDER — OFLOXACIN 0.3 % OP SOLN
1.0000 [drp] | Freq: Four times a day (QID) | OPHTHALMIC | 0 refills | Status: DC
Start: 2023-05-03 — End: 2023-08-24

## 2023-05-03 NOTE — Progress Notes (Signed)
E-Visit for [employer]   We are sorry that you are not feeling well.  Here is how we plan to help!  I am going to treat you for pink eye, but it could be something else, so please be seen if it is not improving.   Based on what you have shared with me it looks like you have conjunctivitis.  Conjunctivitis is a common inflammatory or infectious condition of the eye that is often referred to as "pink eye".  In most cases it is contagious (viral or bacterial). However, not all conjunctivitis requires antibiotics (ex. Allergic).  We have made appropriate suggestions for you based upon your presentation.  I have prescribed Oflaxacin 1-2 drops 4 times a day times 5 days   Pink eye can be highly contagious.  It is typically spread through direct contact with secretions, or contaminated objects or surfaces that one may have touched.  Strict handwashing is suggested with soap and water is urged.  If not available, use alcohol based had sanitizer.  Avoid unnecessary touching of the eye.  If you wear contact lenses, you will need to refrain from wearing them until you see no white discharge from the eye for at least 24 hours after being on medication.  You should see symptom improvement in 1-2 days after starting the medication regimen.  Call us if symptoms are not improved in 1-2 days.  Home Care: Wash your hands often! Do not wear your contacts until you complete your treatment plan. Avoid sharing towels, bed linen, personal items with a person who has pink eye. See attention for anyone in your home with similar symptoms.  Get Help Right Away If: Your symptoms do not improve. You develop blurred or loss of vision. Your symptoms worsen (increased discharge, pain or redness)   Thank you for choosing an e-visit.  Your e-visit answers were reviewed by a board certified advanced clinical practitioner to complete your personal care plan. Depending upon the condition, your plan could have included both over  the counter or prescription medications.  Please review your pharmacy choice. Make sure the pharmacy is open so you can pick up prescription now. If there is a problem, you may contact your provider through [employer] and have the prescription routed to another pharmacy.  Your safety is important to [place]. If you have drug allergies check your prescription carefully.   For the next 24 hours you can use MyChart to ask questions about today's visit, request a non-urgent call back, or ask for a work or school excuse. You will get an email in the next two days asking about your experience. I hope that your e-visit has been valuable and will speed your recovery.  I provided 5 minutes of non face-to-face time during this encounter for chart review, medication and order placement, as well as and documentation.

## 2023-05-11 ENCOUNTER — Telehealth: Payer: Commercial Managed Care - PPO | Admitting: Physician Assistant

## 2023-05-11 DIAGNOSIS — H60502 Unspecified acute noninfective otitis externa, left ear: Secondary | ICD-10-CM | POA: Diagnosis not present

## 2023-05-11 MED ORDER — HYDROCORTISONE-ACETIC ACID 1-2 % OT SOLN
3.0000 [drp] | Freq: Four times a day (QID) | OTIC | 0 refills | Status: AC
Start: 2023-05-11 — End: 2023-05-18

## 2023-05-11 MED ORDER — AZITHROMYCIN 250 MG PO TABS
ORAL_TABLET | ORAL | 0 refills | Status: AC
Start: 2023-05-11 — End: 2023-05-16

## 2023-05-11 NOTE — Progress Notes (Signed)
E Visit for Ear Pain - Swimmer's Ear  We are sorry that you are not feeling well. Here is how we plan to help!  Based on what you have shared with me it looks like you have Swimmer's Ear.  Swimmer's ear is a redness or swelling, irritation, or infection of your outer ear canal. These symptoms usually occur within a few days of swimming. Your ear canal is a tube that goes from the opening of the ear to the eardrum.  When water stays in your ear canal, germs can grow.  This is a painful condition that often happens to children and swimmers of all ages.  It is not contagious and oral antibiotics are not required to treat uncomplicated swimmer's ear.  The usual symptoms include:    Itchiness inside the ear  Redness or a sense of swelling in the ear  Pain when the ear is tugged on when pressure is placed on the ear  Pus draining from the infected ear   I have prescribed Azithromycin 250 mg two tablets by mouth on day 1, then 1 tablet by mouth daily until completed  I have prescribed Acetic acid 2% and hydrocortisone 1% otic solution 3 drops in affected ear(s) every four times per day for 7 days  In certain cases, swimmer's ear may progress to a more serious bacterial infection of the middle or inner ear.  If you have a fever 102 and up and significantly worsening symptoms, this could indicate a more serious infection moving to the middle/inner and needs face to face evaluation in an office by a provider.  Your symptoms should improve over the next 3 days and should resolve in about 7 days.  Be sure to complete ALL of your prescription.  HOME CARE: Wash your hands frequently. If you are prescribed an ear drop, do not place the tip of the bottle on your ear or touch it with your fingers. You can take Acetaminophen 650 mg every 4-6 hours as needed for pain.  If pain is severe or moderate, you can apply a heating pad (set on low) or hot water bottle (wrapped in a towel) to outer ear for 20 minutes.   This will also increase drainage. Avoid ear plugs Do not go swimming until the symptoms are gone Do not use Q-tips After showers, help the water run out by tilting your head to one side.   GET HELP RIGHT AWAY IF: Fever is over 102.2 degrees. You develop progressive ear pain or hearing loss. Ear symptoms persist longer than 3 days after treatment.  MAKE SURE YOU: Understand these instructions. Will watch your condition. Will get help right away if you are not doing well or get worse.  TO PREVENT SWIMMER'S EAR: Use a bathing cap or custom fitted swim molds to keep your ears dry. Towel off after swimming to dry your ears. Tilt your head or pull your earlobes to allow the water to escape your ear canal. If there is still water in your ears, consider using a hairdryer on the lowest setting.  Thank you for choosing an e-visit.  Your e-visit answers were reviewed by a board certified advanced clinical practitioner to complete your personal care plan. Depending upon the condition, your plan could have included both over the counter or prescription medications.  Please review your pharmacy choice. Make sure the pharmacy is open so you can pick up the prescription now. If there is a problem, you may contact your provider through [employer]  and have the prescription routed to another pharmacy.  Your safety is important to [place]. If you have drug allergies check your prescription carefully.   For the next 24 hours you can use MyChart to ask questions about today's visit, request a non-urgent call back, or ask for a work or school excuse. You will get an email with a survey after your eVisit asking about your experience. We would appreciate your feedback. I hope that your e-visit has been valuable and will aid in your recovery.

## 2023-05-11 NOTE — Progress Notes (Signed)
I have spent 5 minutes in review of e-visit questionnaire, review and updating patient chart, medical decision making and response to patient.   Mia Milan Cody Jacklynn Dehaas, PA-C    

## 2023-05-19 ENCOUNTER — Other Ambulatory Visit (HOSPITAL_BASED_OUTPATIENT_CLINIC_OR_DEPARTMENT_OTHER): Payer: Self-pay

## 2023-05-19 MED ORDER — INFLUENZA VIRUS VACC SPLIT PF (FLUZONE) 0.5 ML IM SUSY
0.5000 mL | PREFILLED_SYRINGE | Freq: Once | INTRAMUSCULAR | 0 refills | Status: AC
  Filled 2023-05-19: qty 0.5, 1d supply, fill #0

## 2023-06-08 ENCOUNTER — Other Ambulatory Visit (HOSPITAL_BASED_OUTPATIENT_CLINIC_OR_DEPARTMENT_OTHER): Payer: Self-pay

## 2023-06-21 ENCOUNTER — Telehealth: Payer: Commercial Managed Care - PPO | Admitting: Physician Assistant

## 2023-06-21 DIAGNOSIS — H5710 Ocular pain, unspecified eye: Secondary | ICD-10-CM

## 2023-06-21 NOTE — Progress Notes (Signed)
Because of severe eye pain which is not common with conjunctivitis and raises concern for other causes (deeper infection, elevated pressures in the eye, etc), I feel your condition warrants further evaluation and I recommend that you be seen in a face to face visit.   NOTE: There will be NO CHARGE for this eVisit   If you are having a true medical emergency please call 911.      For an urgent face to face visit, [facility] has eight urgent care centers for your convenience:   NEW!! [facility] Health Urgent Care Center at [facility] Get Driving Directions [phone] [address], [address] [place], [address]    [facility] Health Urgent Care Center at [facility] Get Driving Directions [phone] [address] [place], [place] [address]   [facility] Health Urgent Care Center [facility]) Get Driving Directions [phone] [address] [place], [place] [address]  [facility] Health Urgent Care Center [facility]) Get Driving Directions [phone] [address] [place],  [place]  [address]  [facility] Health Urgent Care Center [facility] - at [employer]  [phone] [address] [place],  [place] [address]   [facility] Health Urgent Care at [facility] Get Driving Directions [phone] [address] [place], [place] [address]   [facility] Health Urgent Care at [facility] Get Driving Directions  [phone] [address] [place], [place] [address]   [facility] Health Urgent Care at [facility] Directions [phone] [address], [address] [place], [place] [address]  Your MyChart E-visit questionnaire answers were reviewed by a board certified advanced clinical practitioner to complete your personal care plan based on your specific symptoms.  Thank you for using e-Visits.

## 2023-07-13 ENCOUNTER — Other Ambulatory Visit: Payer: Self-pay | Admitting: Family Medicine

## 2023-07-26 ENCOUNTER — Ambulatory Visit: Payer: Commercial Managed Care - PPO | Admitting: Family Medicine

## 2023-08-21 ENCOUNTER — Ambulatory Visit: Payer: Commercial Managed Care - PPO | Admitting: Family Medicine

## 2023-08-22 ENCOUNTER — Telehealth: Payer: Commercial Managed Care - PPO | Admitting: Physician Assistant

## 2023-08-22 DIAGNOSIS — R3989 Other symptoms and signs involving the genitourinary system: Secondary | ICD-10-CM

## 2023-08-22 MED ORDER — NITROFURANTOIN MONOHYD MACRO 100 MG PO CAPS
100.0000 mg | ORAL_CAPSULE | Freq: Two times a day (BID) | ORAL | 0 refills | Status: DC
Start: 2023-08-22 — End: 2023-09-06

## 2023-08-22 NOTE — Progress Notes (Signed)

## 2023-08-22 NOTE — Progress Notes (Signed)
 I have spent 5 minutes in review of e-visit questionnaire, review and updating patient chart, medical decision making and response to patient.   Piedad Climes, PA-C

## 2023-08-24 ENCOUNTER — Ambulatory Visit: Payer: Commercial Managed Care - PPO | Admitting: Family Medicine

## 2023-08-24 VITALS — BP 114/75 | HR 76 | Temp 97.4°F | Resp 99 | Wt 170.4 lb

## 2023-08-24 DIAGNOSIS — Z1231 Encounter for screening mammogram for malignant neoplasm of breast: Secondary | ICD-10-CM

## 2023-08-24 DIAGNOSIS — L4050 Arthropathic psoriasis, unspecified: Secondary | ICD-10-CM | POA: Diagnosis not present

## 2023-08-24 DIAGNOSIS — D508 Other iron deficiency anemias: Secondary | ICD-10-CM

## 2023-08-24 DIAGNOSIS — R7309 Other abnormal glucose: Secondary | ICD-10-CM

## 2023-08-24 DIAGNOSIS — R5383 Other fatigue: Secondary | ICD-10-CM | POA: Diagnosis not present

## 2023-08-24 DIAGNOSIS — Z13220 Encounter for screening for lipoid disorders: Secondary | ICD-10-CM | POA: Diagnosis not present

## 2023-08-24 LAB — BAYER DCA HB A1C WAIVED: HB A1C (BAYER DCA - WAIVED): 5.4 % (ref 4.8–5.6)

## 2023-08-24 MED ORDER — FUROSEMIDE 20 MG PO TABS
20.0000 mg | ORAL_TABLET | Freq: Every day | ORAL | 5 refills | Status: AC
  Filled 2024-03-14 – 2024-03-22 (×2): qty 30, 30d supply, fill #0
  Filled 2024-05-16: qty 30, 30d supply, fill #1
  Filled 2024-06-12: qty 30, 30d supply, fill #2
  Filled 2024-07-10: qty 30, 30d supply, fill #3
  Filled 2024-08-10: qty 60, 60d supply, fill #4

## 2023-08-24 MED ORDER — ROPINIROLE HCL ER 2 MG PO TB24
2.0000 mg | ORAL_TABLET | Freq: Every day | ORAL | 1 refills | Status: DC
  Filled 2024-03-25: qty 90, 90d supply, fill #0

## 2023-08-24 MED ORDER — ROPINIROLE HCL 2 MG PO TABS: 1.0000 mg | ORAL_TABLET | Freq: Every day | ORAL | 3 refills | Status: DC

## 2023-08-24 MED ORDER — CARBIDOPA-LEVODOPA 10-100 MG PO TABS: 1.0000 | ORAL_TABLET | Freq: Three times a day (TID) | ORAL | 5 refills | Status: DC

## 2023-08-24 MED ORDER — CETIRIZINE HCL 10 MG PO TABS: 10.0000 mg | ORAL_TABLET | Freq: Every day | ORAL | Status: AC

## 2023-08-24 MED ORDER — MONTELUKAST SODIUM 10 MG PO TABS: 10.0000 mg | ORAL_TABLET | Freq: Every day | ORAL | 2 refills | Status: DC

## 2023-08-24 MED ORDER — CARBIDOPA-LEVODOPA 25-100 MG PO TABS: 1.0000 | ORAL_TABLET | Freq: Three times a day (TID) | ORAL | 2 refills | Status: DC

## 2023-08-24 MED ORDER — POTASSIUM CHLORIDE CRYS ER 10 MEQ PO TBCR: 10.0000 meq | EXTENDED_RELEASE_TABLET | Freq: Every day | ORAL | 5 refills | Status: AC

## 2023-08-24 NOTE — Progress Notes (Signed)
Subjective:  Patient ID: Carla Li, female    DOB: 03/05/1976  Age: 48 y.o. 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 Couldn't tolerate methotrexate.  Now needing to have a referral to rheumatology.  The methotrexate was prescribed by dermatology and the provider has left practice in this area.  Related issues include restless leg for which she takes propranolol but that does does not seem to be effective.  She still has symptoms at night.  Additionally she has been having problems with anemia and needs to have her blood counts checked.  Cholesterol has been high in the past as well and she is due to have that rechecked.       08/24/2023    1:56 PM 02/16/2023    3:43 PM 02/16/2023    3:40 PM  Depression screen PHQ 2/9  Decreased Interest 0 1 0  Down, Depressed, Hopeless 0 0 0  PHQ - 2 Score 0 1 0  Altered sleeping  2   Tired, decreased energy  2   Change in appetite  2   Feeling bad or failure about yourself   0   Trouble concentrating  0   Moving slowly or fidgety/restless  0   Suicidal thoughts  0   PHQ-9 Score  7   Difficult doing work/chores  Not difficult at all     History Carla Li has a past medical history of Asthma, Diabetes mellitus without complication (HCC), and Rheumatoid arthritis (HCC) (03/2023).   She has a past surgical history that includes Abdominal hysterectomy and tube placed and removed in ears.   Her family history includes Asthma in her sister; Celiac disease in her mother; Diabetes in her sister; Drug abuse in her brother; Eczema in her sister; Hearing loss in her sister; Heart murmur in her daughter; Hypotension in her mother; Seizures in her brother.She reports that she has quit smoking. She has never used smokeless tobacco. She reports that she does not currently use alcohol. She reports that she does not use drugs.    ROS Review of Systems  Constitutional:  Negative.   HENT: Negative.    Eyes:  Negative for visual disturbance.  Respiratory:  Negative for shortness of breath.   Cardiovascular:  Negative for chest pain.  Gastrointestinal:  Negative for abdominal pain.  Musculoskeletal:  Positive for arthralgias and myalgias.    Objective:  BP 114/75   Pulse 76   Temp (!) 97.4 F (36.3 C) (Oral)   Resp (!) 99   Wt 170 lb 6.4 oz (77.3 kg)   BMI 26.69 kg/m   BP Readings from Last 3 Encounters:  08/24/23 114/75  02/16/23 111/70  08/18/22 110/63    Wt Readings from Last 3 Encounters:  08/24/23 170 lb 6.4 oz (77.3 kg)  02/16/23 169 lb 12.8 oz (77 kg)  08/18/22 167 lb 3.2 oz (75.8 kg)     Physical Exam Constitutional:      General: She is not in acute distress.    Appearance: She is well-developed.  Cardiovascular:     Rate and Rhythm: Normal rate and regular rhythm.  Pulmonary:     Breath sounds: Normal breath sounds.  Musculoskeletal:        General: Normal range of motion.  Skin:    General: Skin is warm and dry.  Neurological:     Mental Status: She is alert and oriented to person, place, and  time.       Assessment & Plan:   Carla Li was seen today for medical management of chronic issues.  Diagnoses and all orders for this visit:  Psoriatic arthritis (HCC) -     Ambulatory referral to Rheumatology  Other abnormal glucose -     Bayer DCA Hb A1c Waived  Lipid screening -     Lipid panel  Other iron deficiency anemia -     Anemia Profile B  Fatigue, unspecified type -     CBC with Differential/Platelet -     CMP14+EGFR  Encounter for screening mammogram for malignant neoplasm of breast -     MM 3D SCREENING MAMMOGRAM BILATERAL BREAST; Future  Other orders -     Discontinue: carbidopa-levodopa (SINEMET) 25-100 MG tablet; Take 1 tablet by mouth 3 (three) times daily. -     furosemide (LASIX) 20 MG tablet; Take 1 tablet (20 mg total) by mouth daily. As needed for fluid -     Discontinue: rOPINIRole  (REQUIP) 2 MG tablet; Take 0.5 tablets (1 mg total) by mouth at bedtime. -     potassium chloride (KLOR-CON M) 10 MEQ tablet; Take 1 tablet (10 mEq total) by mouth daily. As a potassium supplement -     montelukast (SINGULAIR) 10 MG tablet; Take 1 tablet (10 mg total) by mouth daily. -     cetirizine (ZYRTEC) 10 MG tablet; Take 1 tablet (10 mg total) by mouth daily. For allergy symptoms -     rOPINIRole (REQUIP XL) 2 MG 24 hr tablet; Take 1 tablet (2 mg total) by mouth at bedtime. -     carbidopa-levodopa (SINEMET) 10-100 MG tablet; Take 1 tablet by mouth 3 (three) times daily. For parkinsonism       I have discontinued Carla Li's rOPINIRole, carbidopa-levodopa, ofloxacin, carbidopa-levodopa, and rOPINIRole. I have also changed her potassium chloride and montelukast. Additionally, I am having her start on rOPINIRole and carbidopa-levodopa. Lastly, I am having her maintain her Multiple Vitamins-Minerals (WOMENS MULTIVITAMIN PO), desonide, levalbuterol, EPINEPHrine, benzoyl peroxide-erythromycin, nitrofurantoin (macrocrystal-monohydrate), furosemide, cetirizine, albuterol, albuterol, folic acid, ibuprofen, and loratadine.  Allergies as of 08/24/2023       Reactions   Benadryl [diphenhydramine] Nausea Only   Sulfa Antibiotics Itching, Swelling, Anaphylaxis, Hives   Bactrim [sulfamethoxazole-trimethoprim]    Codeine    REACTION: nausea   Codeine Nausea Only   Penicillins    Pineapple    Sulfonamide Derivatives    REACTION: hives, itching   Wound Dressing Adhesive Rash   Paper tape causes rash-clear tape okay        Medication List        Accurate as of August 24, 2023 11:59 PM. If you have any questions, ask your nurse or doctor.          STOP taking these medications    carbidopa-levodopa 25-100 MG tablet Commonly known as: Sinemet Replaced by: carbidopa-levodopa 10-100 MG tablet Stopped by: Carla Li   ofloxacin 0.3 % ophthalmic solution Commonly known as:  Ocuflox Stopped by: Carla Li   rOPINIRole 2 MG tablet Commonly known as: REQUIP Replaced by: rOPINIRole 2 MG 24 hr tablet Stopped by: Carla Li       TAKE these medications    albuterol 1.25 MG/3ML nebulizer solution Commonly known as: ACCUNEB Take 1 ampule by nebulization every 6 (six) hours as needed.   albuterol 108 (90 Base) MCG/ACT inhaler Commonly known as: VENTOLIN HFA Inhale 2 puffs into the lungs every 4 (four) hours as  needed.   benzoyl peroxide-erythromycin gel Commonly known as: BENZAMYCIN Apply topically 2 (two) times daily.   carbidopa-levodopa 10-100 MG tablet Commonly known as: Sinemet Take 1 tablet by mouth 3 (three) times daily. For parkinsonism Replaces: carbidopa-levodopa 25-100 MG tablet Started by: Carla Li   cetirizine 10 MG tablet Commonly known as: ZYRTEC Take 1 tablet (10 mg total) by mouth daily. For allergy symptoms   desonide 0.05 % ointment Commonly known as: DESOWEN Apply 1 application topically 2 (two) times daily as needed (mild rash flare). Okay to use on the face, neck, groin area. Do not use more than 1 week at a time.   EPINEPHrine 0.3 mg/0.3 mL Soaj injection Commonly known as: EpiPen 2-Pak Inject 0.3 mg into the muscle as needed for anaphylaxis. FOR SEVERE ALLERGIC REACTIONS   folic acid 1 MG tablet Commonly known as: FOLVITE Take 1 mg by mouth daily.   furosemide 20 MG tablet Commonly known as: LASIX Take 1 tablet (20 mg total) by mouth daily. As needed for fluid   ibuprofen 800 MG tablet Commonly known as: ADVIL Take 800 mg by mouth daily.   levalbuterol 45 MCG/ACT inhaler Commonly known as: Xopenex HFA Inhale 2 puffs into the lungs every 4 (four) hours as needed for wheezing or shortness of breath (coughing fits).   loratadine 10 MG tablet Commonly known as: CLARITIN Take 10 mg by mouth daily.   montelukast 10 MG tablet Commonly known as: SINGULAIR Take 1 tablet (10 mg total) by mouth daily.    nitrofurantoin (macrocrystal-monohydrate) 100 MG capsule Commonly known as: Macrobid Take 1 capsule (100 mg total) by mouth 2 (two) times daily.   potassium chloride 10 MEQ tablet Commonly known as: KLOR-CON M Take 1 tablet (10 mEq total) by mouth daily. As a potassium supplement   rOPINIRole 2 MG 24 hr tablet Commonly known as: REQUIP XL Take 1 tablet (2 mg total) by mouth at bedtime. Replaces: rOPINIRole 2 MG tablet Started by: Broadus John Akasha Melena   WOMENS MULTIVITAMIN PO Take by mouth.         Follow-up: Return in about 8 weeks (around 10/18/2023).  Mechele Claude, M.D.

## 2023-08-25 ENCOUNTER — Encounter: Payer: Self-pay | Admitting: Family Medicine

## 2023-08-25 ENCOUNTER — Telehealth: Payer: Self-pay

## 2023-08-25 ENCOUNTER — Ambulatory Visit: Payer: Self-pay | Admitting: Family Medicine

## 2023-08-25 ENCOUNTER — Other Ambulatory Visit (HOSPITAL_COMMUNITY): Payer: Self-pay

## 2023-08-25 LAB — CMP14+EGFR
ALT: 21 [IU]/L (ref 0–32)
AST: 18 [IU]/L (ref 0–40)
Albumin: 4.5 g/dL (ref 3.9–4.9)
Alkaline Phosphatase: 93 [IU]/L (ref 44–121)
BUN/Creatinine Ratio: 14 (ref 9–23)
BUN: 11 mg/dL (ref 6–24)
Bilirubin Total: 0.3 mg/dL (ref 0.0–1.2)
CO2: 23 mmol/L (ref 20–29)
Calcium: 9.5 mg/dL (ref 8.7–10.2)
Chloride: 97 mmol/L (ref 96–106)
Creatinine, Ser: 0.76 mg/dL (ref 0.57–1.00)
Globulin, Total: 2.9 g/dL (ref 1.5–4.5)
Glucose: 81 mg/dL (ref 70–99)
Potassium: 4.7 mmol/L (ref 3.5–5.2)
Sodium: 136 mmol/L (ref 134–144)
Total Protein: 7.4 g/dL (ref 6.0–8.5)
eGFR: 98 mL/min/{1.73_m2} (ref >59)

## 2023-08-25 LAB — ANEMIA PROFILE B
Basophils Absolute: 0.1 10*3/uL (ref 0.0–0.2)
Basos: 1 %
EOS (ABSOLUTE): 0.1 10*3/uL (ref 0.0–0.4)
Eos: 2 %
Ferritin: 37 ng/mL (ref 15–150)
Folate: >20 ng/mL (ref >3.0)
Hematocrit: 42.4 % (ref 34.0–46.6)
Hemoglobin: 13.8 g/dL (ref 11.1–15.9)
Immature Grans (Abs): 0 10*3/uL (ref 0.0–0.1)
Immature Granulocytes: 0 %
Iron Saturation: 25 % (ref 15–55)
Iron: 81 ug/dL (ref 27–159)
Lymphocytes Absolute: 3.1 10*3/uL (ref 0.7–3.1)
Lymphs: 42 %
MCH: 28.5 pg (ref 26.6–33.0)
MCHC: 32.5 g/dL (ref 31.5–35.7)
MCV: 88 fL (ref 79–97)
Monocytes Absolute: 0.9 10*3/uL (ref 0.1–0.9)
Monocytes: 13 %
Neutrophils Absolute: 3.1 10*3/uL (ref 1.4–7.0)
Neutrophils: 42 %
Platelets: 322 10*3/uL (ref 150–450)
RBC: 4.84 x10E6/uL (ref 3.77–5.28)
RDW: 12.6 % (ref 11.7–15.4)
Retic Ct Pct: 0.8 % (ref 0.6–2.6)
Total Iron Binding Capacity: 320 ug/dL (ref 250–450)
UIBC: 239 ug/dL (ref 131–425)
Vitamin B-12: 769 pg/mL (ref 232–1245)
WBC: 7.4 10*3/uL (ref 3.4–10.8)

## 2023-08-25 LAB — LIPID PANEL
Chol/HDL Ratio: 3.3 ratio (ref 0.0–4.4)
Cholesterol, Total: 201 mg/dL — ABNORMAL HIGH (ref 100–199)
HDL: 61 mg/dL
LDL Chol Calc (NIH): 125 mg/dL — ABNORMAL HIGH (ref 0–99)
Triglycerides: 83 mg/dL (ref 0–149)
VLDL Cholesterol Cal: 15 mg/dL (ref 5–40)

## 2023-08-25 NOTE — Telephone Encounter (Signed)
Pt called pharmacy and Pharmacy told pt they cannot fulfill prescription until prior authorization is sent . Pt will need prior authoriztion to be able to get rOPINIRole (REQUIP XL) 2 MG 24 hr tablet . Callback number if needed [phone]

## 2023-08-25 NOTE — Telephone Encounter (Signed)
Copied from CRM [id number]. Topic: Clinical - Prescription Issue >> Aug 25, 2023 12:17 PM Carla Li wrote: Reason for CRM: Pt called pharmacy and Pharmacy told pt they cannot fulfill prescription until prior authorization is sent . Pt will need prior authoriztion to be able to get rOPINIRole (REQUIP XL) 2 MG 24 hr tablet . Callback number if needed [phone]

## 2023-08-25 NOTE — Telephone Encounter (Signed)
Pharmacy Patient Advocate Encounter   Received notification from Pt Calls Messages that prior authorization for rOPINIRole HCl ER 2MG  er tablets is required/requested.   Insurance verification completed.   The patient is insured through [facility] .   Per test claim: PA required; PA submitted to above mentioned insurance via CoverMyMeds Key/confirmation #/EOC [facility] Status is pending

## 2023-08-29 ENCOUNTER — Other Ambulatory Visit (HOSPITAL_COMMUNITY): Payer: Self-pay

## 2023-08-29 NOTE — Telephone Encounter (Signed)
Pharmacy Patient Advocate Encounter  Received notification from [facility] that Prior Authorization for rOPINIRole HCl ER 2MG  er tablets  has been APPROVED from 08/28/23 to 08/26/24. Ran test claim, Copay is $15. This test claim was processed through [facility]- copay amounts may vary at other pharmacies due to pharmacy/plan contracts, or as the patient moves through the different stages of their insurance plan.   PA #/Case ID/Reference #: [id number]

## 2023-09-05 ENCOUNTER — Telehealth: Payer: Commercial Managed Care - PPO | Admitting: Family Medicine

## 2023-09-05 DIAGNOSIS — N898 Other specified noninflammatory disorders of vagina: Secondary | ICD-10-CM

## 2023-09-05 DIAGNOSIS — N329 Bladder disorder, unspecified: Secondary | ICD-10-CM

## 2023-09-05 NOTE — Progress Notes (Signed)
  Because you are still having signs of UTI and with a new onset of symptoms you have never had- odor, we need to have you seen in person for a urine sample/vaginal swab,  your condition warrants further evaluation and I recommend that you be seen in a face-to-face visit at your PCP office or local urgent care.   NOTE: There will be NO CHARGE for this E-Visit   If you are having a true medical emergency, please call 911.

## 2023-09-06 ENCOUNTER — Encounter: Payer: Self-pay | Admitting: Family Medicine

## 2023-09-06 ENCOUNTER — Ambulatory Visit (INDEPENDENT_AMBULATORY_CARE_PROVIDER_SITE_OTHER): Payer: Commercial Managed Care - PPO | Admitting: Family Medicine

## 2023-09-06 VITALS — BP 103/74 | HR 77 | Temp 98.5°F | Ht 67.0 in | Wt 169.0 lb

## 2023-09-06 DIAGNOSIS — Z1331 Encounter for screening for depression: Secondary | ICD-10-CM

## 2023-09-06 DIAGNOSIS — R3989 Other symptoms and signs involving the genitourinary system: Secondary | ICD-10-CM | POA: Diagnosis not present

## 2023-09-06 DIAGNOSIS — R8290 Unspecified abnormal findings in urine: Secondary | ICD-10-CM

## 2023-09-06 LAB — MICROSCOPIC EXAMINATION
RBC, Urine: NONE SEEN /[HPF] (ref 0–2)
Renal Epithel, UA: NONE SEEN /[HPF]
WBC, UA: >30 /[HPF] — AB (ref 0–5)
Yeast, UA: NONE SEEN

## 2023-09-06 LAB — URINALYSIS, ROUTINE W REFLEX MICROSCOPIC
Bilirubin, UA: NEGATIVE
Glucose, UA: NEGATIVE
Nitrite, UA: POSITIVE — AB
RBC, UA: NEGATIVE
Specific Gravity, UA: 1.015 (ref 1.005–1.030)
Urobilinogen, Ur: 1 mg/dL (ref 0.2–1.0)
pH, UA: 7 (ref 5.0–7.5)

## 2023-09-06 LAB — WET PREP FOR TRICH, YEAST, CLUE
Clue Cell Exam: POSITIVE — AB
Trichomonas Exam: NEGATIVE
Yeast Exam: POSITIVE — AB

## 2023-09-06 MED ORDER — METRONIDAZOLE 500 MG PO TABS: 500.0000 mg | ORAL_TABLET | Freq: Two times a day (BID) | ORAL | 0 refills | Status: AC

## 2023-09-06 MED ORDER — FLUCONAZOLE 150 MG PO TABS: 150.0000 mg | ORAL_TABLET | Freq: Once | ORAL | 0 refills | Status: AC

## 2023-09-06 MED ORDER — CEPHALEXIN 500 MG PO CAPS: 500.0000 mg | ORAL_CAPSULE | Freq: Two times a day (BID) | ORAL | 0 refills | Status: AC

## 2023-09-06 NOTE — Progress Notes (Signed)
Subjective:  Patient ID: Carla Li, female    DOB: 03-03-1976, 48 y.o.   MRN: [id number]  Patient Care Team: Mechele Claude, MD as PCP - General (Family Medicine)   Chief Complaint:  Dysuria (Odor/Not emptying well)   HPI: Carla Li is a 48 y.o. female presenting on 09/06/2023 for Dysuria (Odor/Not emptying well)  Dysuria    1. Suspected UTI States that this happens frequently for her when she cannot leave her desk for long periods of time. Reports that ~2 weeks ago she did an Evisit and was prescribed macrobid. States that this typically works for her, but she continued to have symptoms. States that she has an odor, which is new for her. Symptoms include that she is not emptying and burning with urination. She is been completing vaginal wash with showers for several years. She is drinking teas, water, cranberry juice. Denies discharge, fever, N/V, new back pain. Denies discharge or itching.    Relevant past medical, surgical, family, and social history reviewed and updated as indicated.  Allergies and medications reviewed and updated. Data reviewed: Chart in Epic.   Past Medical History:  Diagnosis Date   Asthma    Diabetes mellitus without complication (HCC)    Rheumatoid arthritis (HCC) 03/2023    Past Surgical History:  Procedure Laterality Date   ABDOMINAL HYSTERECTOMY     tube placed and removed in ears      Social History   Socioeconomic History   Marital status: Married    Spouse name: Ray   Number of children: 2   Years of education: Not on file   Highest education level: Some college, no degree  Occupational History   Not on file  Tobacco Use   Smoking status: Former   Smokeless tobacco: Never  Vaping Use   Vaping status: Never Used  Substance and Sexual Activity   Alcohol use: Not Currently   Drug use: Never   Sexual activity: Not Currently    Birth control/protection: Surgical  Other Topics Concern   Not on file  Social History Narrative    Not on file   Social Drivers of Health   Financial Resource Strain: Low Risk  (08/24/2023)   Overall Financial Resource Strain (CARDIA)    Difficulty of Paying Living Expenses: Not hard at all  Food Insecurity: No Food Insecurity (08/24/2023)   Hunger Vital Sign    Worried About Running Out of Food in the Last Year: Never true    Ran Out of Food in the Last Year: Never true  Transportation Needs: No Transportation Needs (08/24/2023)   PRAPARE - Administrator, Civil Service (Medical): No    Lack of Transportation (Non-Medical): No  Physical Activity: Insufficiently Active (08/24/2023)   Exercise Vital Sign    Days of Exercise per Week: 4 days    Minutes of Exercise per Session: 30 min  Stress: Stress Concern Present (08/24/2023)   [employer] of Occupational Health - Occupational Stress Questionnaire    Feeling of Stress : To some extent  Social Connections: Moderately Isolated (08/24/2023)   Social Connection and Isolation Panel [NHANES]    Frequency of Communication with Friends and Family: More than three times a week    Frequency of Social Gatherings with Friends and Family: Once a week    Attends Religious Services: Never    Database administrator or Organizations: No    Attends Banker Meetings: Not on file    Marital  Status: Married  Catering manager Violence: Not on file    Outpatient Encounter Medications as of 09/06/2023  Medication Sig   albuterol (ACCUNEB) 1.25 MG/3ML nebulizer solution Take 1 ampule by nebulization every 6 (six) hours as needed.   albuterol (VENTOLIN HFA) 108 (90 Base) MCG/ACT inhaler Inhale 2 puffs into the lungs every 4 (four) hours as needed.   benzoyl peroxide-erythromycin (BENZAMYCIN) gel Apply topically 2 (two) times daily.   carbidopa-levodopa (SINEMET) 10-100 MG tablet Take 1 tablet by mouth 3 (three) times daily. For parkinsonism   cetirizine (ZYRTEC) 10 MG tablet Take 1 tablet (10 mg total) by mouth daily. For  allergy symptoms   desonide (DESOWEN) 0.05 % ointment Apply 1 application topically 2 (two) times daily as needed (mild rash flare). Okay to use on the face, neck, groin area. Do not use more than 1 week at a time.   EPINEPHrine (EPIPEN 2-PAK) 0.3 mg/0.3 mL IJ SOAJ injection Inject 0.3 mg into the muscle as needed for anaphylaxis. FOR SEVERE ALLERGIC REACTIONS   folic acid (FOLVITE) 1 MG tablet Take 1 mg by mouth daily.   furosemide (LASIX) 20 MG tablet Take 1 tablet (20 mg total) by mouth daily. As needed for fluid   ibuprofen (ADVIL) 800 MG tablet Take 800 mg by mouth daily.   levalbuterol (XOPENEX HFA) 45 MCG/ACT inhaler Inhale 2 puffs into the lungs every 4 (four) hours as needed for wheezing or shortness of breath (coughing fits).   loratadine (CLARITIN) 10 MG tablet Take 10 mg by mouth daily.   montelukast (SINGULAIR) 10 MG tablet Take 1 tablet (10 mg total) by mouth daily.   Multiple Vitamins-Minerals (WOMENS MULTIVITAMIN PO) Take by mouth.   potassium chloride (KLOR-CON M) 10 MEQ tablet Take 1 tablet (10 mEq total) by mouth daily. As a potassium supplement   rOPINIRole (REQUIP XL) 2 MG 24 hr tablet Take 1 tablet (2 mg total) by mouth at bedtime.   [DISCONTINUED] nitrofurantoin, macrocrystal-monohydrate, (MACROBID) 100 MG capsule Take 1 capsule (100 mg total) by mouth 2 (two) times daily.   No facility-administered encounter medications on file as of 09/06/2023.    Allergies  Allergen Reactions   Benadryl [Diphenhydramine] Nausea Only   Sulfa Antibiotics Itching, Swelling, Anaphylaxis and Hives   Bactrim [Sulfamethoxazole-Trimethoprim]    Codeine     REACTION: nausea   Codeine Nausea Only   Penicillins    Pineapple    Sulfonamide Derivatives     REACTION: hives, itching   Wound Dressing Adhesive Rash    Paper tape causes rash-clear tape okay    Review of Systems  Genitourinary:  Positive for dysuria.   As per HPI  Objective:  BP 103/74   Pulse 77   Temp 98.5 F (36.9 C)    Ht 5\' 7"  (1.702 m)   Wt 169 lb (76.7 kg)   SpO2 97%   BMI 26.47 kg/m    Wt Readings from Last 3 Encounters:  09/06/23 169 lb (76.7 kg)  08/24/23 170 lb 6.4 oz (77.3 kg)  02/16/23 169 lb 12.8 oz (77 kg)    Physical Exam Constitutional:      General: She is awake. She is not in acute distress.    Appearance: Normal appearance. She is well-developed and well-groomed. She is not ill-appearing, toxic-appearing or diaphoretic.  Cardiovascular:     Rate and Rhythm: Normal rate and regular rhythm.     Pulses: Normal pulses.          Radial pulses are 2+  on the right side and 2+ on the left side.       Posterior tibial pulses are 2+ on the right side and 2+ on the left side.     Heart sounds: Normal heart sounds. No murmur heard.    No gallop.  Pulmonary:     Effort: Pulmonary effort is normal. No respiratory distress.     Breath sounds: Normal breath sounds. No stridor. No wheezing, rhonchi or rales.  Abdominal:     General: Abdomen is flat. Bowel sounds are normal. There is no distension.     Palpations: There is no mass.     Tenderness: There is abdominal tenderness in the suprapubic area. There is no right CVA tenderness, left CVA tenderness, guarding or rebound.     Hernia: No hernia is present.  Musculoskeletal:     Cervical back: Full passive range of motion without pain and neck supple.     Right lower leg: No edema.     Left lower leg: No edema.  Skin:    General: Skin is warm.     Capillary Refill: Capillary refill takes less than 2 seconds.  Neurological:     General: No focal deficit present.     Mental Status: She is alert, oriented to person, place, and time and easily aroused. Mental status is at baseline.     GCS: GCS eye subscore is 4. GCS verbal subscore is 5. GCS motor subscore is 6.     Motor: No weakness.  Psychiatric:        Attention and Perception: Attention and perception normal.        Mood and Affect: Mood and affect normal.        Speech: Speech  normal.        Behavior: Behavior normal. Behavior is cooperative.        Thought Content: Thought content normal. Thought content does not include homicidal or suicidal ideation. Thought content does not include homicidal or suicidal plan.        Cognition and Memory: Cognition and memory normal.        Judgment: Judgment normal.     Results for orders placed or performed in visit on 08/24/23  Bayer DCA Hb A1c Waived   Collection Time: 08/24/23  2:32 PM  Result Value Ref Range   HB A1C (BAYER DCA - WAIVED) 5.4 4.8 - 5.6 %  CMP14+EGFR   Collection Time: 08/24/23  2:33 PM  Result Value Ref Range   Glucose 81 70 - 99 mg/dL   BUN 11 6 - 24 mg/dL   Creatinine, Ser 1.61 0.57 - 1.00 mg/dL   eGFR 98 >09 UE/AVW/0.98   BUN/Creatinine Ratio 14 9 - 23   Sodium 136 134 - 144 mmol/L   Potassium 4.7 3.5 - 5.2 mmol/L   Chloride 97 96 - 106 mmol/L   CO2 23 20 - 29 mmol/L   Calcium 9.5 8.7 - 10.2 mg/dL   Total Protein 7.4 6.0 - 8.5 g/dL   Albumin 4.5 3.9 - 4.9 g/dL   Globulin, Total 2.9 1.5 - 4.5 g/dL   Bilirubin Total 0.3 0.0 - 1.2 mg/dL   Alkaline Phosphatase 93 44 - 121 IU/L   AST 18 0 - 40 IU/L   ALT 21 0 - 32 IU/L  Lipid panel   Collection Time: 08/24/23  2:33 PM  Result Value Ref Range   Cholesterol, Total 201 (H) 100 - 199 mg/dL   Triglycerides 83 0 - 149 mg/dL  HDL 61 >39 mg/dL   VLDL Cholesterol Cal 15 5 - 40 mg/dL   LDL Chol Calc (NIH) 782 (H) 0 - 99 mg/dL   Chol/HDL Ratio 3.3 0.0 - 4.4 ratio  Anemia Profile B   Collection Time: 08/24/23  2:33 PM  Result Value Ref Range   Total Iron Binding Capacity 320 250 - 450 ug/dL   UIBC 956 213 - 086 ug/dL   Iron 81 27 - 578 ug/dL   Iron Saturation 25 15 - 55 %   Ferritin 37 15 - 150 ng/mL   Vitamin B-12 769 232 - 1,245 pg/mL   Folate >20.0 >3.0 ng/mL   WBC 7.4 3.4 - 10.8 x10E3/uL   RBC 4.84 3.77 - 5.28 x10E6/uL   Hemoglobin 13.8 11.1 - 15.9 g/dL   Hematocrit 46.9 62.9 - 46.6 %   MCV 88 79 - 97 fL   MCH 28.5 26.6 - 33.0 pg    MCHC 32.5 31.5 - 35.7 g/dL   RDW 52.8 41.3 - 24.4 %   Platelets 322 150 - 450 x10E3/uL   Neutrophils 42 Not Estab. %   Lymphs 42 Not Estab. %   Monocytes 13 Not Estab. %   Eos 2 Not Estab. %   Basos 1 Not Estab. %   Neutrophils Absolute 3.1 1.4 - 7.0 x10E3/uL   Lymphocytes Absolute 3.1 0.7 - 3.1 x10E3/uL   Monocytes Absolute 0.9 0.1 - 0.9 x10E3/uL   EOS (ABSOLUTE) 0.1 0.0 - 0.4 x10E3/uL   Basophils Absolute 0.1 0.0 - 0.2 x10E3/uL   Immature Granulocytes 0 Not Estab. %   Immature Grans (Abs) 0.0 0.0 - 0.1 x10E3/uL   Retic Ct Pct 0.8 0.6 - 2.6 %       09/06/2023    9:21 AM 08/24/2023    1:56 PM 02/16/2023    3:43 PM 02/16/2023    3:40 PM 08/18/2022    1:05 PM  Depression screen PHQ 2/9  Decreased Interest 1 0 1 0 0  Down, Depressed, Hopeless 0 0 0 0 0  PHQ - 2 Score 1 0 1 0 0  Altered sleeping 2  2  2   Tired, decreased energy 3  2  3   Change in appetite 1  2  0  Feeling bad or failure about yourself  0  0  0  Trouble concentrating 1  0  0  Moving slowly or fidgety/restless 0  0  0  Suicidal thoughts 0  0  0  PHQ-9 Score 8  7  5   Difficult doing work/chores   Not difficult at all  Not difficult at all       09/06/2023    9:22 AM 08/24/2023    1:57 PM 02/16/2023    3:44 PM 08/18/2022    1:06 PM  GAD 7 : Generalized Anxiety Score  Nervous, Anxious, on Edge 0 3 0 0  Control/stop worrying 0 0 0 0  Worry too much - different things 0 0 0 0  Trouble relaxing 2 3 2 2   Restless 2 0 2 3  Easily annoyed or irritable 0 3 2 0  Afraid - awful might happen 0 0 0 0  Total GAD 7 Score 4 9 6 5   Anxiety Difficulty Not difficult at all Not difficult at all Somewhat difficult Not difficult at all      Pertinent labs & imaging results that were available during my care of the patient were reviewed by me and considered in my medical decision making.  Assessment & Plan:  Carla Li was seen today for dysuria.  Diagnoses and all orders for this visit:  1. Suspected UTI (Primary) Based on  UA, will send in keflex as below. Limited by patient allergies. States that she has tolerated keflex/cefdinir in the past. Based on wet prep will send in metronidazole and diflucan as below. Patient to follow up if symptoms continue. Discussed red flag symptoms.  - Urinalysis, Routine w reflex microscopic - Urine Culture - WET PREP FOR TRICH, YEAST, CLUE - cephALEXin (KEFLEX) 500 MG capsule; Take 1 capsule (500 mg total) by mouth 2 (two) times daily for 7 days.  Dispense: 14 capsule; Refill: 0 - metroNIDAZOLE (FLAGYL) 500 MG tablet; Take 1 tablet (500 mg total) by mouth 2 (two) times daily for 7 days.  Dispense: 14 tablet; Refill: 0 - fluconazole (DIFLUCAN) 150 MG tablet; Take 1 tablet (150 mg total) by mouth once for 1 dose. May take second dose 72 hours after first dose if symptoms remain  Dispense: 2 tablet; Refill: 0  2. Abnormal urine odor Based on wet prep will send in metronidazole and diflucan as below. Patient to follow up if symptoms continue. Discussed red flag symptoms.  - Urinalysis, Routine w reflex microscopic - Urine Culture - WET PREP FOR TRICH, YEAST, CLUE - cephALEXin (KEFLEX) 500 MG capsule; Take 1 capsule (500 mg total) by mouth 2 (two) times daily for 7 days.  Dispense: 14 capsule; Refill: 0 - metroNIDAZOLE (FLAGYL) 500 MG tablet; Take 1 tablet (500 mg total) by mouth 2 (two) times daily for 7 days.  Dispense: 14 tablet; Refill: 0 - fluconazole (DIFLUCAN) 150 MG tablet; Take 1 tablet (150 mg total) by mouth once for 1 dose. May take second dose 72 hours after first dose if symptoms remain  Dispense: 2 tablet; Refill: 0  3. Encounter for screening for depression Symptoms stable. Denies SI. Patient to follow up with PCP.    Continue all other maintenance medications.  Follow up plan: Return if symptoms worsen or fail to improve.   Continue healthy lifestyle choices, including diet (rich in fruits, vegetables, and lean proteins, and low in salt and simple carbohydrates)  and exercise (at least 30 minutes of moderate physical activity daily).  Written and verbal instructions provided   The above assessment and management plan was discussed with the patient. The patient verbalized understanding of and has agreed to the management plan. Patient is aware to call the clinic if they develop any new symptoms or if symptoms persist or worsen. Patient is aware when to return to the clinic for a follow-up visit. Patient educated on when it is appropriate to go to the emergency department.   Neale Burly, DNP-FNP [facility] [address] [place], [place] [address] [phone]

## 2023-09-07 ENCOUNTER — Ambulatory Visit (HOSPITAL_BASED_OUTPATIENT_CLINIC_OR_DEPARTMENT_OTHER)
Admission: RE | Admit: 2023-09-07 | Discharge: 2023-09-07 | Disposition: A | Discharge status: Home / Self Care | Payer: Commercial Managed Care - PPO | Source: Ambulatory Visit | Attending: Family Medicine | Admitting: Family Medicine

## 2023-09-07 ENCOUNTER — Ambulatory Visit: Payer: Commercial Managed Care - PPO | Admitting: Nurse Practitioner

## 2023-09-07 ENCOUNTER — Encounter (HOSPITAL_BASED_OUTPATIENT_CLINIC_OR_DEPARTMENT_OTHER): Payer: Self-pay

## 2023-09-07 DIAGNOSIS — Z1231 Encounter for screening mammogram for malignant neoplasm of breast: Secondary | ICD-10-CM | POA: Insufficient documentation

## 2023-09-11 LAB — URINE CULTURE

## 2023-09-12 ENCOUNTER — Encounter: Payer: Self-pay | Admitting: Family Medicine

## 2023-09-12 NOTE — Progress Notes (Signed)
Positive for Klebsiella aerogenes for UTI. Would like for patient to complete current abx. Please follow up for repeat Urine culture in one week. If she is still having symptoms after completing medications, would like to repeat wet prep as well.

## 2023-09-12 NOTE — Addendum Note (Signed)
Addended by: Neale Burly on: 09/12/2023 08:24 AM   Modules accepted: Orders

## 2023-09-14 ENCOUNTER — Encounter: Payer: Self-pay | Admitting: Family Medicine

## 2023-09-14 ENCOUNTER — Other Ambulatory Visit: Payer: Commercial Managed Care - PPO

## 2023-09-14 DIAGNOSIS — R3989 Other symptoms and signs involving the genitourinary system: Secondary | ICD-10-CM

## 2023-09-14 LAB — MICROSCOPIC EXAMINATION
RBC, Urine: NONE SEEN /[HPF] (ref 0–2)
Renal Epithel, UA: NONE SEEN /[HPF]
WBC, UA: >30 /[HPF] — AB (ref 0–5)

## 2023-09-14 LAB — URINALYSIS, ROUTINE W REFLEX MICROSCOPIC
Bilirubin, UA: NEGATIVE
Glucose, UA: NEGATIVE
Nitrite, UA: NEGATIVE
Specific Gravity, UA: 1.02 (ref 1.005–1.030)
Urobilinogen, Ur: 1 mg/dL (ref 0.2–1.0)
pH, UA: 6.5 (ref 5.0–7.5)

## 2023-09-14 MED ORDER — CIPROFLOXACIN HCL 250 MG PO TABS: 250.0000 mg | ORAL_TABLET | Freq: Two times a day (BID) | ORAL | 0 refills | Status: AC

## 2023-09-14 MED ORDER — FLUCONAZOLE 150 MG PO TABS: ORAL_TABLET | ORAL | 0 refills | Status: DC

## 2023-09-14 NOTE — Progress Notes (Signed)
 Based on UA, will send in additional abx. Will start ciprofloxacin  twice daily for 3 days. Stop immediately if signs of tendinitis, tendon rupture, peripheral neuropathy, and central nervous system effects begin. Please avoid use of NSAIDs while taking cipro . May increase effects of Requip . Will also provide diflucan  as yeast was seen on exam. Would like patient to take diflucan  once weekly for 4 weeks.

## 2023-09-16 LAB — URINE CULTURE

## 2023-09-25 ENCOUNTER — Encounter: Payer: Self-pay | Admitting: Family Medicine

## 2023-09-28 ENCOUNTER — Ambulatory Visit: Payer: Commercial Managed Care - PPO | Admitting: Family Medicine

## 2023-09-29 ENCOUNTER — Telehealth: Payer: Commercial Managed Care - PPO | Admitting: Emergency Medicine

## 2023-09-29 DIAGNOSIS — J069 Acute upper respiratory infection, unspecified: Secondary | ICD-10-CM

## 2023-09-29 MED ORDER — IPRATROPIUM BROMIDE 0.03 % NA SOLN: 2.0000 | Freq: Two times a day (BID) | NASAL | 0 refills | Status: DC

## 2023-09-29 MED ORDER — BENZONATATE 100 MG PO CAPS: 100.0000 mg | ORAL_CAPSULE | Freq: Two times a day (BID) | ORAL | 0 refills | Status: DC | PRN

## 2023-09-29 NOTE — Progress Notes (Signed)

## 2023-10-01 ENCOUNTER — Telehealth: Payer: Commercial Managed Care - PPO | Admitting: Family

## 2023-10-01 DIAGNOSIS — J069 Acute upper respiratory infection, unspecified: Secondary | ICD-10-CM

## 2023-10-01 MED ORDER — FLUTICASONE PROPIONATE 50 MCG/ACT NA SUSP: 2.0000 | Freq: Every day | NASAL | 6 refills | Status: DC

## 2023-10-01 MED ORDER — BENZONATATE 100 MG PO CAPS: 100.0000 mg | ORAL_CAPSULE | Freq: Three times a day (TID) | ORAL | 0 refills | Status: DC | PRN

## 2023-10-01 NOTE — Progress Notes (Signed)

## 2023-10-19 ENCOUNTER — Ambulatory Visit (INDEPENDENT_AMBULATORY_CARE_PROVIDER_SITE_OTHER): Payer: Commercial Managed Care - PPO | Admitting: Family Medicine

## 2023-10-19 ENCOUNTER — Encounter: Payer: Self-pay | Admitting: Family Medicine

## 2023-10-19 VITALS — BP 126/80 | HR 74 | Temp 97.4°F | Ht 67.0 in | Wt 170.0 lb

## 2023-10-19 DIAGNOSIS — F321 Major depressive disorder, single episode, moderate: Secondary | ICD-10-CM | POA: Diagnosis not present

## 2023-10-19 MED ORDER — MIRTAZAPINE 15 MG PO TABS: 15.0000 mg | ORAL_TABLET | Freq: Every day | ORAL | 2 refills | Status: DC

## 2023-10-19 NOTE — Progress Notes (Signed)
 Subjective:  Patient ID: Carla Li, female    DOB: June 26, 1976  Age: 47 y.o. MRN: [id number]  CC: Medical Management of Chronic Issues (Any refills from now on need to go to [place] mail order for 90 days.) and Fussy (Pt has been very irritable and noticed by others. Lack of motivation and then sometimes is the opposite and she can't stop moving. )   HPI Carla Li presents for irritability. Her husband, daughters sayings she is to fussy, testy. Going to counseling.      10/19/2023   11:13 AM 09/06/2023    9:21 AM 08/24/2023    1:56 PM  Depression screen PHQ 2/9  Decreased Interest 0 1 0  Down, Depressed, Hopeless 1 0 0  PHQ - 2 Score 1 1 0  Altered sleeping 3 2   Tired, decreased energy 3 3   Change in appetite 0 1   Feeling bad or failure about yourself  0 0   Trouble concentrating 3 1   Moving slowly or fidgety/restless 0 0   Suicidal thoughts 0 0   PHQ-9 Score 10 8   Difficult doing work/chores Not difficult at all      History Carla Li has a past medical history of Asthma, Diabetes mellitus without complication (HCC), and Rheumatoid arthritis (HCC) (03/2023).   She has a past surgical history that includes Abdominal hysterectomy and tube placed and removed in ears.   Her family history includes Asthma in her sister; Celiac disease in her mother; Diabetes in her sister; Drug abuse in her brother; Eczema in her sister; Hearing loss in her sister; Heart murmur in her daughter; Hypotension in her mother; Seizures in her brother.She reports that she has quit smoking. She has never used smokeless tobacco. She reports that she does not currently use alcohol. She reports that she does not use drugs.    ROS Review of Systems  Constitutional: Negative.   HENT: Negative.    Eyes:  Negative for visual disturbance.  Respiratory:  Negative for shortness of breath.   Cardiovascular:  Negative for chest pain.  Gastrointestinal:  Negative for abdominal pain.  Musculoskeletal:   Negative for arthralgias.  Psychiatric/Behavioral:  Positive for agitation, decreased concentration and dysphoric mood. The patient is nervous/anxious.     Objective:  BP 126/80   Pulse 74   Temp (!) 97.4 F (36.3 C)   Ht 5\' 7"  (1.702 m)   Wt 170 lb (77.1 kg)   SpO2 100%   BMI 26.63 kg/m   BP Readings from Last 3 Encounters:  10/19/23 126/80  09/06/23 103/74  08/24/23 114/75    Wt Readings from Last 3 Encounters:  10/19/23 170 lb (77.1 kg)  09/06/23 169 lb (76.7 kg)  08/24/23 170 lb 6.4 oz (77.3 kg)     Physical Exam Constitutional:      General: She is not in acute distress.    Appearance: She is well-developed.  Cardiovascular:     Rate and Rhythm: Normal rate and regular rhythm.  Pulmonary:     Breath sounds: Normal breath sounds.  Musculoskeletal:        General: Normal range of motion.  Skin:    General: Skin is warm and dry.  Neurological:     Mental Status: She is alert and oriented to person, place, and time.     Motor: No weakness.       Assessment & Plan:   Tyniah was seen today for medical management of chronic issues and fussy.  Diagnoses and all orders for this visit:  Current moderate episode of major depressive disorder, unspecified whether recurrent (HCC)  Other orders -     mirtazapine (REMERON) 15 MG tablet; Take 1 tablet (15 mg total) by mouth at bedtime.       I have discontinued Akila Gondek's benzoyl peroxide-erythromycin, folic acid, fluconazole, fluticasone, and benzonatate. I am also having her start on mirtazapine. Additionally, I am having her maintain her Multiple Vitamins-Minerals (WOMENS MULTIVITAMIN PO), desonide, levalbuterol, EPINEPHrine, furosemide, potassium chloride, montelukast, cetirizine, albuterol, albuterol, ibuprofen, loratadine, rOPINIRole, carbidopa-levodopa, and ipratropium.  Allergies as of 10/19/2023       Reactions   Benadryl [diphenhydramine] Nausea Only   Sulfa Antibiotics Itching, Swelling,  Anaphylaxis, Hives   Bactrim [sulfamethoxazole-trimethoprim]    Codeine    REACTION: nausea   Codeine Nausea Only   Penicillins    Pineapple    Sulfonamide Derivatives    REACTION: hives, itching   Wound Dressing Adhesive Rash   Paper tape causes rash-clear tape okay        Medication List        Accurate as of October 19, 2023 11:59 PM. If you have any questions, ask your nurse or doctor.          STOP taking these medications    benzonatate 100 MG capsule Commonly known as: Lawyer Stopped by: Atalya Dano   benzoyl peroxide-erythromycin gel Commonly known as: Conservator, museum/gallery by: Miriana Gaertner   fluconazole 150 MG tablet Commonly known as: Diflucan Stopped by: Takelia Urieta   fluticasone 50 MCG/ACT nasal spray Commonly known as: FLONASE Stopped by: Mairany Bruno   folic acid 1 MG tablet Commonly known as: FOLVITE Stopped by: Lowen Mansouri       TAKE these medications    albuterol 1.25 MG/3ML nebulizer solution Commonly known as: ACCUNEB Take 1 ampule by nebulization every 6 (six) hours as needed.   albuterol 108 (90 Base) MCG/ACT inhaler Commonly known as: VENTOLIN HFA Inhale 2 puffs into the lungs every 4 (four) hours as needed.   carbidopa-levodopa 10-100 MG tablet Commonly known as: Sinemet Take 1 tablet by mouth 3 (three) times daily. For parkinsonism   cetirizine 10 MG tablet Commonly known as: ZYRTEC Take 1 tablet (10 mg total) by mouth daily. For allergy symptoms   desonide 0.05 % ointment Commonly known as: DESOWEN Apply 1 application topically 2 (two) times daily as needed (mild rash flare). Okay to use on the face, neck, groin area. Do not use more than 1 week at a time.   EPINEPHrine 0.3 mg/0.3 mL Soaj injection Commonly known as: EpiPen 2-Pak Inject 0.3 mg into the muscle as needed for anaphylaxis. FOR SEVERE ALLERGIC REACTIONS   furosemide 20 MG tablet Commonly known as: LASIX Take 1 tablet (20 mg total) by mouth  daily. As needed for fluid   ibuprofen 800 MG tablet Commonly known as: ADVIL Take 800 mg by mouth daily.   ipratropium 0.03 % nasal spray Commonly known as: ATROVENT Place 2 sprays into both nostrils every 12 (twelve) hours.   levalbuterol 45 MCG/ACT inhaler Commonly known as: Xopenex HFA Inhale 2 puffs into the lungs every 4 (four) hours as needed for wheezing or shortness of breath (coughing fits).   loratadine 10 MG tablet Commonly known as: CLARITIN Take 10 mg by mouth daily.   mirtazapine 15 MG tablet Commonly known as: REMERON Take 1 tablet (15 mg total) by mouth at bedtime. Started by: Donterrius Santucci   montelukast 10 MG  tablet Commonly known as: SINGULAIR Take 1 tablet (10 mg total) by mouth daily.   potassium chloride 10 MEQ tablet Commonly known as: KLOR-CON M Take 1 tablet (10 mEq total) by mouth daily. As a potassium supplement   rOPINIRole 2 MG 24 hr tablet Commonly known as: REQUIP XL Take 1 tablet (2 mg total) by mouth at bedtime.   WOMENS MULTIVITAMIN PO Take by mouth.         Follow-up: Return in about 1 month (around 11/19/2023).  Mechele Claude, M.D.

## 2023-11-10 ENCOUNTER — Telehealth: Admitting: Physician Assistant

## 2023-11-10 DIAGNOSIS — J069 Acute upper respiratory infection, unspecified: Secondary | ICD-10-CM

## 2023-11-10 MED ORDER — FLUTICASONE PROPIONATE 50 MCG/ACT NA SUSP: 2.0000 | Freq: Every day | NASAL | 0 refills | Status: AC

## 2023-11-10 MED ORDER — PSEUDOEPH-BROMPHEN-DM 30-2-10 MG/5ML PO SYRP: 5.0000 mL | ORAL_SOLUTION | Freq: Four times a day (QID) | ORAL | 0 refills | Status: DC | PRN

## 2023-11-10 NOTE — Progress Notes (Signed)
 E-Visit for Upper Respiratory Infection   We are sorry you are not feeling well.  Here is how we plan to help!  Based on what you have shared with me, it looks like you may have a viral upper respiratory infection.  Upper respiratory infections are caused by a large number of viruses; however, rhinovirus is the most common cause.   Symptoms vary from person to person, with common symptoms including sore throat, cough, fatigue or lack of energy and feeling of general discomfort.  A low-grade fever of up to 100.4 may present, but is often uncommon.  Symptoms vary however, and are closely related to a person's age or underlying illnesses.  The most common symptoms associated with an upper respiratory infection are nasal discharge or congestion, cough, sneezing, headache and pressure in the ears and face.  These symptoms usually persist for about 3 to 10 days, but can last up to 2 weeks.  It is important to know that upper respiratory infections do not cause serious illness or complications in most cases.    Upper respiratory infections can be transmitted from person to person, with the most common method of transmission being a person's hands.  The virus is able to live on the skin and can infect other persons for up to 2 hours after direct contact.  Also, these can be transmitted when someone coughs or sneezes; thus, it is important to cover the mouth to reduce this risk.  To keep the spread of the illness at bay, good hand hygiene is very important.  This is an infection that is most likely caused by a virus. There are no specific treatments other than to help you with the symptoms until the infection runs its course.  We are sorry you are not feeling well.  Here is how we plan to help!   For nasal congestion, you may use an oral decongestants such as Mucinex D or if you have glaucoma or high blood pressure use plain Mucinex.  Saline nasal spray or nasal drops can help and can safely be used as often as  needed for congestion.  For your congestion, I have prescribed Fluticasone nasal spray one spray in each nostril twice a day  If you do not have a history of heart disease, hypertension, diabetes or thyroid disease, prostate/bladder issues or glaucoma, you may also use Sudafed to treat nasal congestion.  It is highly recommended that you consult with a pharmacist or your primary care physician to ensure this medication is safe for you to take.     For cough I have prescribed for you Bromfed DM cough syrup Take 5mL every 6 hours as needed for cough.  If you have a sore or scratchy throat, use a saltwater gargle-  to  teaspoon of salt dissolved in a 4-ounce to 8-ounce glass of warm water.  Gargle the solution for approximately 15-30 seconds and then spit.  It is important not to swallow the solution.  You can also use throat lozenges/cough drops and Chloraseptic spray to help with throat pain or discomfort.  Warm or cold liquids can also be helpful in relieving throat pain.  For headache, pain or general discomfort, you can use Ibuprofen or Tylenol as directed.   Some authorities believe that zinc sprays or the use of Echinacea may shorten the course of your symptoms.   HOME CARE Only take medications as instructed by your medical team. Be sure to drink plenty of fluids. Water is fine as well  as fruit juices, sodas and electrolyte beverages. You may want to stay away from caffeine or alcohol. If you are nauseated, try taking small sips of liquids. How do you know if you are getting enough fluid? Your urine should be a pale yellow or almost colorless. Get rest. Taking a steamy shower or using a humidifier may help nasal congestion and ease sore throat pain. You can place a towel over your head and breathe in the steam from hot water coming from a faucet. Using a saline nasal spray works much the same way. Cough drops, hard candies and sore throat lozenges may ease your cough. Avoid close contacts  especially the very young and the elderly Cover your mouth if you cough or sneeze Always remember to wash your hands.   GET HELP RIGHT AWAY IF: You develop worsening fever. If your symptoms do not improve within 10 days You develop yellow or green discharge from your nose over 3 days. You have coughing fits You develop a severe head ache or visual changes. You develop shortness of breath, difficulty breathing or start having chest pain Your symptoms persist after you have completed your treatment plan  MAKE SURE YOU  Understand these instructions. Will watch your condition. Will get help right away if you are not doing well or get worse.  Thank you for choosing an e-visit.  Your e-visit answers were reviewed by a board certified advanced clinical practitioner to complete your personal care plan. Depending upon the condition, your plan could have included both over the counter or prescription medications.  Please review your pharmacy choice. Make sure the pharmacy is open so you can pick up prescription now. If there is a problem, you may contact your provider through [employer] and have the prescription routed to another pharmacy.  Your safety is important to [place]. If you have drug allergies check your prescription carefully.   For the next 24 hours you can use MyChart to ask questions about today's visit, request a non-urgent call back, or ask for a work or school excuse. You will get an email in the next two days asking about your experience. I hope that your e-visit has been valuable and will speed your recovery.     I have spent 5 minutes in review of e-visit questionnaire, review and updating patient chart, medical decision making and response to patient.   Margaretann Loveless, PA-C

## 2023-11-15 ENCOUNTER — Encounter

## 2023-11-15 ENCOUNTER — Telehealth: Admitting: Physician Assistant

## 2023-11-15 DIAGNOSIS — R3989 Other symptoms and signs involving the genitourinary system: Secondary | ICD-10-CM

## 2023-11-15 MED ORDER — NITROFURANTOIN MONOHYD MACRO 100 MG PO CAPS
100.0000 mg | ORAL_CAPSULE | Freq: Two times a day (BID) | ORAL | 0 refills | Status: DC
Start: 2023-11-15 — End: 2024-02-15

## 2023-11-15 NOTE — Progress Notes (Signed)
 I have spent 5 minutes in review of e-visit questionnaire, review and updating patient chart, medical decision making and response to patient.   Piedad Climes, PA-C

## 2023-11-15 NOTE — Progress Notes (Signed)

## 2023-11-23 ENCOUNTER — Ambulatory Visit: Admitting: Family Medicine

## 2023-12-13 ENCOUNTER — Ambulatory Visit: Admitting: Family Medicine

## 2023-12-14 ENCOUNTER — Encounter (HOSPITAL_COMMUNITY): Payer: Self-pay

## 2023-12-14 ENCOUNTER — Ambulatory Visit: Admitting: Family Medicine

## 2023-12-21 ENCOUNTER — Ambulatory Visit: Admitting: Family Medicine

## 2023-12-26 DIAGNOSIS — R232 Flushing: Secondary | ICD-10-CM | POA: Diagnosis not present

## 2023-12-26 DIAGNOSIS — Z01419 Encounter for gynecological examination (general) (routine) without abnormal findings: Secondary | ICD-10-CM | POA: Diagnosis not present

## 2023-12-26 DIAGNOSIS — F3489 Other specified persistent mood disorders: Secondary | ICD-10-CM | POA: Diagnosis not present

## 2023-12-26 DIAGNOSIS — Z8742 Personal history of other diseases of the female genital tract: Secondary | ICD-10-CM | POA: Diagnosis not present

## 2023-12-26 DIAGNOSIS — Z90711 Acquired absence of uterus with remaining cervical stump: Secondary | ICD-10-CM | POA: Diagnosis not present

## 2023-12-26 DIAGNOSIS — N951 Menopausal and female climacteric states: Secondary | ICD-10-CM | POA: Diagnosis not present

## 2024-01-04 ENCOUNTER — Telehealth: Admitting: Family Medicine

## 2024-01-04 DIAGNOSIS — N390 Urinary tract infection, site not specified: Secondary | ICD-10-CM

## 2024-01-04 DIAGNOSIS — R232 Flushing: Secondary | ICD-10-CM | POA: Diagnosis not present

## 2024-01-04 NOTE — Progress Notes (Signed)
  Because you received recent treatment in April for a UTI and it seems to have come back, you will need a urine sample this time. Your condition warrants further evaluation and I recommend that you be seen in a face-to-face visit at your PCP office or local urgent care.    NOTE: There will be NO CHARGE for this E-Visit   If you are having a true medical emergency, please call 911.

## 2024-01-16 ENCOUNTER — Other Ambulatory Visit: Payer: Self-pay | Admitting: Family Medicine

## 2024-02-02 ENCOUNTER — Other Ambulatory Visit (HOSPITAL_BASED_OUTPATIENT_CLINIC_OR_DEPARTMENT_OTHER): Payer: Self-pay

## 2024-02-02 ENCOUNTER — Other Ambulatory Visit: Payer: Self-pay

## 2024-02-02 ENCOUNTER — Other Ambulatory Visit (HOSPITAL_COMMUNITY): Payer: Self-pay

## 2024-02-02 MED ORDER — ESTRADIOL 0.05 MG/24HR TD PTTW
1.0000 | MEDICATED_PATCH | TRANSDERMAL | 0 refills | Status: DC
  Filled 2024-02-02: qty 16, 56d supply, fill #0
  Filled 2024-02-02: qty 8, 28d supply, fill #0
  Filled 2024-03-06: qty 8, 28d supply, fill #1

## 2024-02-14 NOTE — Progress Notes (Signed)
 Office Visit Note  Patient: Carla Li             Date of Birth: 1975-09-02           MRN: [id number]             PCP: Zollie Lowers, MD Referring: Zollie Lowers, MD Visit Date: 02/15/2024 Occupation: [employer]  Subjective:  New Patient (Initial Visit) (Patient states she has hand weakness and pain. Patient states she has joint pain also in her back. Patient states she does get back spasms. Patient states she has rashes behind her ears. )   Discussed the use of AI scribe software for clinical note transcription with the patient, who gave verbal consent to proceed.  History of Present Illness   Carla Li is a 48 year old female with psoriasis who presents for evaluation of persistent skin and joint symptoms. She was referred by her family doctor for evaluation of psoriasis and suspected psoriatic arthritis.  She has a history of psoriasis, initially misdiagnosed as eczema, which began two summers ago with a rash on her chest, buttocks, and ankles. The rash was oozing, causing her clothes to stick to it, and persisted despite treatment with desonide  cream. Changes in laundry, soap, and lotion helped reduce the rash over the summer. She reports that her ENT suggested her persistent ear drainage may be related to an allergy to her hearing aid molds.  She experiences joint pain, particularly in her hands, which is most severe in the mornings and after activities such as gardening and cooking. Her fingernails are described as 'paper thin', and there is swelling in her hands and feet, especially after standing for long periods at work. She takes ibuprofen 800 mg to manage the pain, which is located in her hands, under her ribs, and sometimes in her feet and ankles.  She tried methotrexate for three months starting in December but discontinued it due to increased body pain. Her family doctor prescribed ibuprofen, which provides some relief.  She has a history of childhood asthma and  takes medication for it. She recently discovered her biological father's identity but is unaware of any family history of autoimmune diseases.  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 She has a history of waitressing for twelve years, which may have contributed to her foot pain.       Activities of Daily Living:  Patient reports morning stiffness for 1-1.5 hours.   Patient Reports nocturnal pain.  Difficulty dressing/grooming: Denies Difficulty climbing stairs: Reports Difficulty getting out of chair: Denies Difficulty using hands for taps, buttons, cutlery, and/or writing: Reports  Review of Systems  Constitutional:  Positive for fatigue.  HENT:  Negative for mouth sores and mouth dryness.   Eyes:  Negative for dryness.  Respiratory:  Positive for shortness of breath.   Cardiovascular:  Negative for chest pain and palpitations.  Gastrointestinal:  Positive for constipation. Negative for blood in stool and diarrhea.  Endocrine: Negative for increased urination.  Genitourinary:  Negative for involuntary urination.  Musculoskeletal:  Positive for joint pain, gait problem, joint pain, myalgias, morning stiffness, muscle tenderness and myalgias. Negative for joint swelling and muscle weakness.  Skin:  Positive for rash and hair loss. Negative for color change and sensitivity to sunlight.  Allergic/Immunologic: Positive for susceptible to infections.  Neurological:  Positive for dizziness. Negative for headaches.  Hematological:  Positive for swollen glands.  Psychiatric/Behavioral:  Negative  for depressed mood and sleep disturbance. The patient is nervous/anxious.     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    Chronic eczematous otitis externa of both ears 02/21/2018   Eustachian tube dysfunction, bilateral 02/21/2018   Adenomyosis 11/01/2016   Restless legs syndrome 11/26/2015   Asthma 01/29/2015   Other allergic rhinitis 01/29/2015   Otitis externa 01/29/2015   HYPOKALEMIA 07/30/2009   ANEMIA-IRON DEFICIENCY 07/30/2009   DIABETES MELLITUS, BORDERLINE 07/30/2009    Past Medical History:  Diagnosis Date   Asthma    Diabetes mellitus without complication (HCC)    Rheumatoid arthritis (HCC) 03/2023    Family History  Problem Relation Age of Onset   Celiac disease Mother    Hypotension Mother    Asthma Sister    Diabetes Sister    Hearing loss Sister    Eczema Sister    Heart murmur Daughter    Drug abuse Brother    Seizures Brother    Past Surgical History:  Procedure Laterality Date   ABDOMINAL HYSTERECTOMY     tube placed and removed in ears     Social History   Social History Narrative   Not on file   Immunization History  Administered Date(s) Administered   Influenza, Seasonal, Injecte, Preservative Fre 05/19/2023   Influenza,inj,Quad PF,6+ Mos 05/19/2022   Pneumococcal Polysaccharide-23 07/12/2016   Tdap 12/24/2015     Objective: Vital Signs: BP 116/81 (BP Location: Right Arm, Patient Position: Sitting, Cuff Size: Normal)   Pulse 76   Resp 14   Ht 5' 7.5 (1.715 m)   Wt 166 lb (75.3 kg)   BMI 25.62 kg/m    Physical Exam Eyes:     Conjunctiva/sclera: Conjunctivae normal.  Cardiovascular:     Rate and Rhythm: Normal rate and regular rhythm.  Pulmonary:     Effort: Pulmonary effort is normal.     Breath sounds: Normal breath sounds.  Musculoskeletal:     Right lower leg: No edema.     Left lower leg: No edema.  Lymphadenopathy:     Cervical: No cervical adenopathy.  Skin:    General: Skin is warm and dry.     Findings: Rash present.     Comments: Erythematous rash in visible portion of ear canal and concha and behind ears, yellowish scale  overlying Nails thin with filing/sanding changes Normal nailfold capillaries  Neurological:     Mental Status: She is alert.  Psychiatric:        Mood and Affect: Mood normal.      Musculoskeletal Exam:  Shoulders full ROM no tenderness or swelling Elbows full ROM no tenderness or swelling Wrists full ROM, tenderness to pressure with no swelling Fingers full ROM no tenderness or swelling Low back midline and bilateral paraspinal muscle tenderness above iliac crest, no radiation Hip normal internal and external rotation without pain, no tenderness to lateral hip palpation Knees full ROM no tenderness or swelling Ankles full ROM no tenderness or swelling MTPs painful with flexion, tenderness to pressure at anterior and middle foot without palpable swelling or nodules   Investigation: No additional findings.  Imaging: No results found.  Recent Labs: Lab Results  Component Value Date   WBC 7.4 08/24/2023   HGB 13.8 08/24/2023   PLT 322 08/24/2023   NA 136 08/24/2023  K 4.7 08/24/2023   CL 97 08/24/2023   CO2 23 08/24/2023   GLUCOSE 81 08/24/2023   BUN 11 08/24/2023   CREATININE 0.76 08/24/2023   BILITOT 0.3 08/24/2023   ALKPHOS 93 08/24/2023   AST 18 08/24/2023   ALT 21 08/24/2023   PROT 7.4 08/24/2023   ALBUMIN 4.5 08/24/2023   CALCIUM 9.5 08/24/2023   GFRAA  12/03/2009    >60        The eGFR has been calculated using the MDRD equation. This calculation has not been validated in all clinical situations. eGFR's persistently <60 mL/min signify possible Chronic Kidney Disease.    Speciality Comments: No specialty comments available.  Procedures:  No procedures performed Allergies: Benadryl [diphenhydramine], Sulfa antibiotics, Bactrim [sulfamethoxazole-trimethoprim], Codeine, Codeine, Penicillins, Pineapple, Sulfonamide derivatives, and Wound dressing adhesive   Assessment / Plan:     Visit Diagnoses: Psoriatic arthritis (HCC) - Plan: XR Hand 2 View  Right, XR Hand 2 View Left, Sedimentation rate, C-reactive protein Chronic pain and swelling in hands, feet, and lower back with morning stiffness. Partially controlled with ibuprofen 800 mg daily.  Methotrexate was ineffective tried for 3 months. Suspected psoriatic arthritis due to joint symptoms and psoriasis history. There is no definitive synovitis or dactylitis on exam today. JAK inhibitors considered for joint and skin symptoms. - Order x-ray of hands to assess for inflammatory vs degenerative changes - Order blood tests including sed rate and CRP to assess systemic inflammation. - Consider JAK inhibitor (Rinvoq or Earma) for treatment, pending insurance approval. - Discuss injectable biologics (Humira, Enbrel) if JAK inhibitors are not approved.  Chronic eczematous otitis externa of both ears Psoriasis Psoriasis affects chest, buttocks, ankles, and ears. Currently only active rash at the ears. There are also chronic fingernail changes without any definite pitting.Topical steroids have not been very effective. Frequent complication of infection or at least colonization with associated drainage.  High risk medication use - Plan: CBC with Differential/Platelet, Comprehensive metabolic panel with GFR, QuantiFERON-TB Gold Plus, Hepatitis B core antibody, IgM, Hepatitis B surface antigen, Hepatitis C antibody Discussed risks of Rinvoq including infections, cytopenias, hepatotoxicity, hyperlipidemia, major cardiovascular events.  She does not have any pre-existing contraindication of blood clots, bowel perforation, major cardiovascular vents.  Had very mild hyperlipidemia on labs from January no other cardiovascular risk factors. - Checking CBC CMP hepatitis B and C screening and QuantiFERON baseline medication monitoring for Jak inhibitor  Chronic bilateral low back pain without sciatica - Plan: XR Lumbar Spine 2-3 Views Likely muscular related with no red flags for radiculopathy at this time.  Does not have specific pattern of inflammatory back pain. - Order x-ray of lower back to evaluate for sacroiliitis.  Hearing aid-related contact dermatitis Atopic dermatitis Persistent ear drainage linked to hearing aid-related contact dermatitis. Minimal relief from desonide . JAK inhibitors would be preferable option due to overlap PsA and refractory AD. Continuing follow up with ENT may be switching back to previous ear mold which was better tolerated.  Orders: Orders Placed This Encounter  Procedures   XR Hand 2 View Right   XR Hand 2 View Left   XR Lumbar Spine 2-3 Views   Sedimentation rate   C-reactive protein   CBC with Differential/Platelet   Comprehensive metabolic panel with GFR   QuantiFERON-TB Gold Plus   Hepatitis B core antibody, IgM   Hepatitis B surface antigen   Hepatitis C antibody   No orders of the defined types were placed in this encounter.   Follow-Up Instructions:  Return in about 3 months (around 05/17/2024) for New pt PsA/AD ?UPA start f/u 3mos.   Lonni LELON Ester, MD  Note - This record has been created using [employer].  Chart creation errors have been sought, but may not always  have been located. Such creation errors do not reflect on  the standard of medical care.

## 2024-02-15 ENCOUNTER — Ambulatory Visit: Payer: Commercial Managed Care - PPO | Attending: Internal Medicine | Admitting: Internal Medicine

## 2024-02-15 ENCOUNTER — Encounter: Payer: Self-pay | Admitting: Internal Medicine

## 2024-02-15 ENCOUNTER — Ambulatory Visit

## 2024-02-15 VITALS — BP 122/83 | HR 80 | Resp 14 | Ht 67.5 in | Wt 166.0 lb

## 2024-02-15 DIAGNOSIS — L4050 Arthropathic psoriasis, unspecified: Secondary | ICD-10-CM

## 2024-02-15 DIAGNOSIS — G8929 Other chronic pain: Secondary | ICD-10-CM

## 2024-02-15 DIAGNOSIS — Z79899 Other long term (current) drug therapy: Secondary | ICD-10-CM

## 2024-02-15 DIAGNOSIS — M545 Low back pain, unspecified: Secondary | ICD-10-CM

## 2024-02-15 DIAGNOSIS — M79641 Pain in right hand: Secondary | ICD-10-CM | POA: Diagnosis not present

## 2024-02-15 DIAGNOSIS — M79642 Pain in left hand: Secondary | ICD-10-CM

## 2024-02-15 DIAGNOSIS — H608X3 Other otitis externa, bilateral: Secondary | ICD-10-CM

## 2024-02-15 NOTE — Progress Notes (Signed)
 Pharmacy Note  Subjective: Patient presents today to [facility] for follow up office visit. Patient seen by the pharmacist for counseling on Rinvoq for psoriatic arthritis and eczema.  Previous therapy include:Methotrexate (inadequate response)  History of diverticulitis:  No  History of MI, stroke, or CV events:  No  Objective:  CMP     Component Value Date/Time   NA 136 08/24/2023 1433   K 4.7 08/24/2023 1433   CL 97 08/24/2023 1433   CO2 23 08/24/2023 1433   GLUCOSE 81 08/24/2023 1433   GLUCOSE 103 (H) 06/02/2020 1652   BUN 11 08/24/2023 1433   CREATININE 0.76 08/24/2023 1433   CALCIUM 9.5 08/24/2023 1433   PROT 7.4 08/24/2023 1433   ALBUMIN 4.5 08/24/2023 1433   AST 18 08/24/2023 1433   ALT 21 08/24/2023 1433   ALKPHOS 93 08/24/2023 1433    CBC    Component Value Date/Time   WBC 7.4 08/24/2023 1433   WBC 16.8 (H) 06/02/2020 1652   RBC 4.84 08/24/2023 1433   RBC 4.67 06/02/2020 1652   HGB 13.8 08/24/2023 1433   HCT 42.4 08/24/2023 1433   PLT 322 08/24/2023 1433   MCV 88 08/24/2023 1433   MCH 28.5 08/24/2023 1433   MCH 27.0 06/02/2020 1652   MCHC 32.5 08/24/2023 1433   MCHC 32.3 06/02/2020 1652   RDW 12.6 08/24/2023 1433    Baseline Immunosuppressant Therapy Labs TB GOLD   Hepatitis Panel   HIV No results found for: HIV Immunoglobulins   SPEP    Latest Ref Rng & Units 08/24/2023    2:33 PM  Serum Protein Electrophoresis  Total Protein 6.0 - 8.5 g/dL 7.4    H3EI No results found for: G6PDH TPMT No results found for: TPMT   Lipid Panel Lab Results  Component Value Date   CHOL 201 (H) 08/24/2023   HDL 61 08/24/2023   LDLCALC 125 (H) 08/24/2023   TRIG 83 08/24/2023   CHOLHDL 3.3 08/24/2023     Assessment/Plan:  Counseled patient that Rinvoq is a JAK inhibitor indicated for Psoriatic Arthritis.  Counseled patient on purpose, proper use, and adverse effects of Rinvoq.    Reviewed the most common adverse effects including  infection, diarrhea, headaches.  Also reviewed rare adverse effects such as bowel injury and the need to contact us  if they develop stomach pain during treatment. Counseled on the increase risk of venous thrombosis. Counseled about FDA black box warning of MACE (major adverse CV events including cardiovascular death, myocardial infarction, and stroke).  Reviewed with patient that there is the possibility of an increased risk of malignancy specifically lung cancer and lymphomas but it is not well understood if this increased risk is due to the medication or the disease state. Instructed patient that medication should be held for infection and prior to surgery.  Advised patient to avoid live vaccines. Recommend annual influenza, PCV 15 or PCV20 or Pneumovax 23, and Shingrix as indicated.    Patient will discuss with her husband on getting Shingrix.   Reviewed importance of routine lab monitoring including lipid panel.  Will recheck lipid panel 3 months after starting and annually thereafter. CBC and CMP will be monitored routinely every 3 months. Standing orders placed. Provided patient with medication education material and answered all questions.  Patient consented to Rinvoq.  Will upload into patient's chart.  Will apply through patient's insurance and update when we receive a response.    Patient dose will be 15 mg daily.  Prescription will be sent to pharmacy pending lab results and insurance approval.   Deleta Colt PharmD Candidate [id number]  [facility]

## 2024-02-15 NOTE — Patient Instructions (Signed)
 Standing Labs We placed an order today for your standing lab work.   Please have your standing labs drawn in 1 month then every 3 months  Please have your labs drawn 2 weeks prior to your appointment so that the provider can discuss your lab results at your appointment, if possible.  Please note that you may see your imaging and lab results in MyChart before we have reviewed them. We will contact you once all results are reviewed. Please allow our office up to 72 hours to thoroughly review all of the results before contacting the office for clarification of your results.  WALK-IN LAB HOURS  Monday through Thursday from 8:00 am -12:30 pm and 1:00 pm-4:30 pm and Friday from 8:00 am-12:00 pm.  Patients with office visits requiring labs will be seen before walk-in labs.  You may encounter longer than normal wait times. Please allow additional time. Wait times may be shorter on  Monday and Thursday afternoons.  We do not book appointments for walk-in labs. We appreciate your patience and understanding with our staff.   Labs are drawn by [facility]. Please bring your co-pay at the time of your lab draw.  You may receive a bill from [facility] for your lab work.  Please note if you are on Hydroxychloroquine and and an order has been placed for a Hydroxychloroquine level,  you will need to have it drawn 4 hours or more after your last dose.  If you wish to have your labs drawn at another location, please call the office 24 hours in advance so we can fax the orders.  The office is located at [address], [place], [place] [address]   If you have any questions regarding directions or hours of operation,  please call [phone].   As a reminder, please drink plenty of water prior to coming for your lab work. Thanks!

## 2024-02-16 ENCOUNTER — Telehealth: Payer: Self-pay | Admitting: Pharmacist

## 2024-02-16 NOTE — Telephone Encounter (Signed)
-----   Message from Sherry GORMAN Pennant sent at 02/15/2024  9:40 AM EDT ----- Pending OV note from today and baseline labs, patient will be Rinvoq new start Dx: PsA and atopic dermatitis

## 2024-02-19 LAB — COMPREHENSIVE METABOLIC PANEL WITH GFR
AG Ratio: 1.5 (calc) (ref 1.0–2.5)
ALT: 31 U/L — ABNORMAL HIGH (ref 6–29)
AST: 22 U/L (ref 10–35)
Albumin: 4.8 g/dL (ref 3.6–5.1)
Alkaline phosphatase (APISO): 80 U/L (ref 31–125)
BUN: 11 mg/dL (ref 7–25)
CO2: 27 mmol/L (ref 20–32)
Calcium: 9.7 mg/dL (ref 8.6–10.2)
Chloride: 100 mmol/L (ref 98–110)
Creat: 0.7 mg/dL (ref 0.50–0.99)
Globulin: 3.2 g/dL (ref 1.9–3.7)
Glucose, Bld: 85 mg/dL (ref 65–99)
Potassium: 4.3 mmol/L (ref 3.5–5.3)
Sodium: 137 mmol/L (ref 135–146)
Total Bilirubin: 0.4 mg/dL (ref 0.2–1.2)
Total Protein: 8 g/dL (ref 6.1–8.1)
eGFR: 108 mL/min/1.73m2 (ref >=60)

## 2024-02-19 LAB — CBC WITH DIFFERENTIAL/PLATELET
Absolute Lymphocytes: 2151 {cells}/uL (ref 850–3900)
Absolute Monocytes: 764 {cells}/uL (ref 200–950)
Basophils Absolute: 80 {cells}/uL (ref 0–200)
Basophils Relative: 1.2 %
Eosinophils Absolute: 67 {cells}/uL (ref 15–500)
Eosinophils Relative: 1 %
HCT: 45.4 % — ABNORMAL HIGH (ref 35.0–45.0)
Hemoglobin: 14.8 g/dL (ref 11.7–15.5)
MCH: 28.2 pg (ref 27.0–33.0)
MCHC: 32.6 g/dL (ref 32.0–36.0)
MCV: 86.5 fL (ref 80.0–100.0)
MPV: 11.6 fL (ref 7.5–12.5)
Monocytes Relative: 11.4 %
Neutro Abs: 3638 {cells}/uL (ref 1500–7800)
Neutrophils Relative %: 54.3 %
Platelets: 306 Thousand/uL (ref 140–400)
RBC: 5.25 Million/uL — ABNORMAL HIGH (ref 3.80–5.10)
RDW: 12.5 % (ref 11.0–15.0)
Total Lymphocyte: 32.1 %
WBC: 6.7 Thousand/uL (ref 3.8–10.8)

## 2024-02-19 LAB — QUANTIFERON-TB GOLD PLUS
Mitogen-NIL: 9.61 [IU]/mL
NIL: 0.02 [IU]/mL
QuantiFERON-TB Gold Plus: NEGATIVE
TB1-NIL: 0 [IU]/mL
TB2-NIL: <0 [IU]/mL

## 2024-02-19 LAB — HEPATITIS C ANTIBODY: Hepatitis C Ab: NONREACTIVE

## 2024-02-19 LAB — HEPATITIS B SURFACE ANTIGEN: Hepatitis B Surface Ag: NONREACTIVE

## 2024-02-19 LAB — C-REACTIVE PROTEIN: CRP: <3 mg/L (ref <8.0)

## 2024-02-19 LAB — SEDIMENTATION RATE: Sed Rate: 17 mm/h (ref 0–20)

## 2024-02-19 LAB — HEPATITIS B CORE ANTIBODY, IGM: Hep B C IgM: NONREACTIVE

## 2024-02-22 ENCOUNTER — Ambulatory Visit: Payer: Self-pay | Admitting: Internal Medicine

## 2024-02-22 NOTE — Progress Notes (Signed)
 Lab results all look good.  Her sed rate and CRP are normal so did not indicate a high level of systemic inflammation.  Blood counts and kidney and liver function test are normal.  The hepatitis and tuberculosis screening tests for Rinvoq were negative.  I have no problem starting the medication as planned.  X-ray of the hands look normal with no evidence of degenerative or erosive damage.  Her lumbar spine does show early degenerative disc disease changes.

## 2024-02-23 NOTE — Telephone Encounter (Signed)
 Patient is Rinvoq new start.  Submitted a Prior Authorization request to [facility] for RINVOQ via CoverMyMeds. Will update once we receive a response.  Key: [id number]

## 2024-02-26 ENCOUNTER — Other Ambulatory Visit (HOSPITAL_COMMUNITY): Payer: Self-pay

## 2024-02-26 NOTE — Telephone Encounter (Signed)
 Received a fax regarding Prior Authorization from [facility] for RINVOQ. Authorization has been DENIED because patient must try and fail or be unable to take TNF inhibitors  Urgent appeal submitted via [facility]  PA Case ID #: [id number]

## 2024-02-27 NOTE — Telephone Encounter (Signed)
 Received fax from [employer] that denial for Rinvoq has been UPHELD. Patient must try and fail or be unable to take (contraindication) a TNF inhibitor.  Patient does not appear to have any contraindications to TNF inhibitor. Discussed with Dr. Jeannetta. Will move forward with Enbrel instead of Humira  Please start BIV for Enbrel [employer].   Sherry Pennant, PharmD, MPH, BCPS, CPP Clinical Pharmacist (Rheumatology and Pulmonology)

## 2024-02-28 ENCOUNTER — Telehealth: Payer: Self-pay

## 2024-02-28 DIAGNOSIS — Z79899 Other long term (current) drug therapy: Secondary | ICD-10-CM

## 2024-02-28 DIAGNOSIS — L4050 Arthropathic psoriasis, unspecified: Secondary | ICD-10-CM

## 2024-02-28 NOTE — Telephone Encounter (Signed)
 Rinvoq denied. Patient must try and fail one TNF inhibitor  Submitted a Prior Authorization request to [facility] for SKYRIZI SQ via CoverMyMeds. Will update once we receive a response.  Key: [id number]

## 2024-03-04 MED ORDER — ENBREL MINI 50 MG/ML ~~LOC~~ SOCT
50.0000 mg | SUBCUTANEOUS | 0 refills | Status: DC
Start: 2024-03-04 — End: 2024-03-04

## 2024-03-04 MED ORDER — ENBREL SURECLICK 50 MG/ML ~~LOC~~ SOAJ
50.0000 mg | SUBCUTANEOUS | 0 refills | Status: DC
  Filled 2024-03-11: qty 4, 28d supply, fill #0

## 2024-03-04 NOTE — Telephone Encounter (Signed)
 Called patient regarding Enbrel  new start appointment. Patient willing to try Enbrel  instead of Rinvoq. Counseled patient on dosing and adverse effects of Enbrel . Patient denies infection, antibiotic use, or any recent/planned surgeries coming up. Advised patient to hold Enbrel  in case of infection, antibiotic use or surgery. Patient must sign up for copay card prior to new start appointment. Advised patient to call the company. Provided phone number via MyChart message. Patient due to start Enbrel  in Clinic 03/14/2024 at 11:00. Informed patient that [facility] will reach out in the next few days to coordinate shipment of Enbrel  to Clinic.   Enbrel  copay card phone#: [phone]  Deleta Colt PharmD Candidate [id number]  [facility]

## 2024-03-04 NOTE — Telephone Encounter (Signed)
 Received notification from [facility] regarding a prior authorization for ENBREL . Authorization has been APPROVED from 03/01/2024 to 08/28/2024. Approval letter sent to scan center.  Patient must fill through [facility]: [phone]   Authorization # [id number]  Patient must call to enroll into Enbrel  copay card: phone is [phone]. Will not allow me to enroll her due to an unspecific error  Scheduled for Enbrel  new start on 03/14/2024. Rx sent to [facility] for [facility] to coordinate shipment of medication to clinic  Sherry Pennant, PharmD, MPH, BCPS, CPP Clinical Pharmacist (Rheumatology and Pulmonology)

## 2024-03-06 ENCOUNTER — Other Ambulatory Visit (HOSPITAL_BASED_OUTPATIENT_CLINIC_OR_DEPARTMENT_OTHER): Payer: Self-pay

## 2024-03-06 ENCOUNTER — Other Ambulatory Visit: Payer: Self-pay

## 2024-03-06 DIAGNOSIS — H906 Mixed conductive and sensorineural hearing loss, bilateral: Secondary | ICD-10-CM | POA: Diagnosis not present

## 2024-03-06 MED ORDER — CARBIDOPA-LEVODOPA 25-100 MG PO TABS
1.0000 | ORAL_TABLET | Freq: Three times a day (TID) | ORAL | 2 refills | Status: DC
  Filled 2024-03-27: qty 30, 10d supply, fill #0
  Filled 2024-04-17: qty 30, 10d supply, fill #1
  Filled 2024-04-24 – 2024-04-26 (×2): qty 30, 10d supply, fill #2

## 2024-03-06 MED ORDER — ESTRADIOL 0.05 MG/24HR TD PTTW
1.0000 | MEDICATED_PATCH | TRANSDERMAL | 3 refills | Status: AC
  Filled 2024-03-27 – 2024-03-28 (×2): qty 24, 84d supply, fill #0
  Filled 2024-06-27: qty 24, 84d supply, fill #1

## 2024-03-06 MED ORDER — ROPINIROLE HCL 2 MG PO TABS
1.0000 mg | ORAL_TABLET | Freq: Every day | ORAL | 3 refills | Status: AC
  Filled 2024-06-21: qty 45, 90d supply, fill #0

## 2024-03-06 MED ORDER — MIRTAZAPINE 15 MG PO TABS: 15.0000 mg | ORAL_TABLET | Freq: Every day | ORAL | 2 refills | Status: DC

## 2024-03-06 MED ORDER — POTASSIUM CHLORIDE ER 10 MEQ PO TBCR: 10.0000 meq | EXTENDED_RELEASE_TABLET | Freq: Every day | ORAL | 5 refills | Status: AC

## 2024-03-08 ENCOUNTER — Encounter (HOSPITAL_BASED_OUTPATIENT_CLINIC_OR_DEPARTMENT_OTHER): Payer: Self-pay

## 2024-03-08 ENCOUNTER — Other Ambulatory Visit (HOSPITAL_BASED_OUTPATIENT_CLINIC_OR_DEPARTMENT_OTHER): Payer: Self-pay

## 2024-03-08 ENCOUNTER — Telehealth: Admitting: Physician Assistant

## 2024-03-08 DIAGNOSIS — R21 Rash and other nonspecific skin eruption: Secondary | ICD-10-CM | POA: Diagnosis not present

## 2024-03-08 MED ORDER — BENZOYL PEROXIDE-ERYTHROMYCIN 5-3 % EX GEL
Freq: Two times a day (BID) | CUTANEOUS | 0 refills | Status: DC
  Filled 2024-03-08: qty 23.3, 7d supply, fill #0

## 2024-03-08 NOTE — Progress Notes (Signed)
 E Visit for Rash  We are sorry that you are not feeling well. Here is how we plan to help!   Based upon what you have shared with me it looks like you have a bacterial follicultits.  Folliculitis is inflammation of the hair follicles that can be caused by a superficial infection of the skin and is treated with an antibiotic. I have prescribed: Erythromycin  ointment.    HOME CARE:  Take cool showers and avoid direct sunlight. Apply cool compress or wet dressings. Take a bath in an oatmeal bath.  Sprinkle content of one Aveeno packet under running faucet with comfortably warm water.  Bathe for 15-20 minutes, 1-2 times daily.  Pat dry with a towel. Do not rub the rash. Use hydrocortisone  cream. Take an antihistamine like Benadryl for widespread rashes that itch.  The adult dose of Benadryl is 25-50 mg by mouth 4 times daily. Caution:  This type of medication may cause sleepiness.  Do not drink alcohol, drive, or operate dangerous machinery while taking antihistamines.  Do not take these medications if you have prostate enlargement.  Read package instructions thoroughly on all medications that you take.  GET HELP RIGHT AWAY IF:  Symptoms don't go away after treatment. Severe itching that persists. If you rash spreads or swells. If you rash begins to smell. If it blisters and opens or develops a yellow-brown crust. You develop a fever. You have a sore throat. You become short of breath.  MAKE SURE YOU:  Understand these instructions. Will watch your condition. Will get help right away if you are not doing well or get worse.  Thank you for choosing an e-visit.  Your e-visit answers were reviewed by a board certified advanced clinical practitioner to complete your personal care plan. Depending upon the condition, your plan could have included both over the counter or prescription medications.  Please review your pharmacy choice. Make sure the pharmacy is open so you can pick up  prescription now. If there is a problem, you may contact your provider through [employer] and have the prescription routed to another pharmacy.  Your safety is important to us . If you have drug allergies check your prescription carefully.   For the next 24 hours you can use MyChart to ask questions about today's visit, request a non-urgent call back, or ask for a work or school excuse. You will get an email in the next two days asking about your experience. I hope that your e-visit has been valuable and will speed your recovery.

## 2024-03-08 NOTE — Progress Notes (Signed)
 I have spent 5 minutes in review of e-visit questionnaire, review and updating patient chart, medical decision making and response to patient.   Laure Kidney, PA-C

## 2024-03-09 ENCOUNTER — Encounter: Admitting: Nurse Practitioner

## 2024-03-09 ENCOUNTER — Other Ambulatory Visit (HOSPITAL_BASED_OUTPATIENT_CLINIC_OR_DEPARTMENT_OTHER): Payer: Self-pay

## 2024-03-09 DIAGNOSIS — R21 Rash and other nonspecific skin eruption: Secondary | ICD-10-CM

## 2024-03-09 MED ORDER — CLINDAMYCIN PHOS-BENZOYL PEROX 1-5 % EX GEL
Freq: Two times a day (BID) | CUTANEOUS | 0 refills | Status: DC
  Filled 2024-03-09: qty 50, 30d supply, fill #0

## 2024-03-09 NOTE — Progress Notes (Signed)
 Dup evisit

## 2024-03-11 ENCOUNTER — Other Ambulatory Visit: Payer: Self-pay

## 2024-03-11 ENCOUNTER — Other Ambulatory Visit (HOSPITAL_COMMUNITY): Payer: Self-pay

## 2024-03-11 NOTE — Progress Notes (Signed)
 Specialty Pharmacy Initial Fill Coordination Note  Carla Li is a 48 y.o. female contacted today regarding initial fill of specialty medication(s) Etanercept  (Enbrel  SureClick)   Patient requested Courier to Provider Office   Delivery date: 03/13/24   Verified address: [address] [place], [place] [address]   Medication will be filled on 03/12/2024.   Patient is enrolled into copay card program and is aware of $0 copayment.

## 2024-03-12 ENCOUNTER — Other Ambulatory Visit: Payer: Self-pay

## 2024-03-14 ENCOUNTER — Other Ambulatory Visit: Payer: Self-pay

## 2024-03-14 ENCOUNTER — Other Ambulatory Visit (HOSPITAL_COMMUNITY): Payer: Self-pay

## 2024-03-14 ENCOUNTER — Ambulatory Visit: Attending: Internal Medicine | Admitting: Pharmacist

## 2024-03-14 ENCOUNTER — Other Ambulatory Visit (HOSPITAL_BASED_OUTPATIENT_CLINIC_OR_DEPARTMENT_OTHER): Payer: Self-pay

## 2024-03-14 DIAGNOSIS — L4050 Arthropathic psoriasis, unspecified: Secondary | ICD-10-CM

## 2024-03-14 DIAGNOSIS — Z79899 Other long term (current) drug therapy: Secondary | ICD-10-CM | POA: Diagnosis not present

## 2024-03-14 DIAGNOSIS — Z7189 Other specified counseling: Secondary | ICD-10-CM

## 2024-03-14 DIAGNOSIS — H608X3 Other otitis externa, bilateral: Secondary | ICD-10-CM | POA: Diagnosis not present

## 2024-03-14 MED ORDER — ENBREL SURECLICK 50 MG/ML ~~LOC~~ SOAJ
50.0000 mg | SUBCUTANEOUS | 1 refills | Status: DC
  Filled 2024-03-14 – 2024-04-01 (×3): qty 4, 28d supply, fill #0
  Filled 2024-04-29: qty 4, 28d supply, fill #1

## 2024-03-14 NOTE — Patient Instructions (Signed)
 Your next ENBREL  dose is due on 03/21/24, 03/28/24, and every 7 days thereafter  HOLD ENBREL  if you have signs or symptoms of an infection. You can resume once you feel better or back to your baseline. HOLD ENBREL  if you start antibiotics to treat an infection. HOLD ENBREL  around the time of surgery/procedures. Your surgeon will be able to provide recommendations on when to hold BEFORE and when you are cleared to RESUME.  Pharmacy information: Your prescription will be shipped from [facility]. Their phone number is [phone] They will call to schedule shipment and confirm address. They will mail your medication to your home.  Labs are due in 1 month then every 3 months. Lab hours are from Monday to Thursday 8am-12:30pm and 1pm-4pm and Friday 8am-12pm. You do not need an appointment if you come for labs during these times. If you'd like to go to a [facility] or [facility] closer to home, please call our clinic 48 hours prior to lab date so we can release orders in a timely manner.  Stay up to date on all routine vaccines: influenza, pneumonia, COVID19, Shingles  How to manage an injection site reaction: Remember the 5 C's: COUNTER - leave on the counter at least 30 minutes but up to overnight to bring medication to room temperature. This may help prevent stinging COLD - place something cold (like an ice gel pack or cold water bottle) on the injection site just before cleansing with alcohol. This may help reduce pain CLARITIN - use Claritin (generic name is loratadine) for the first two weeks of treatment or the day of, the day before, and the day after injecting. This will help to minimize injection site reactions CORTISONE CREAM - apply if injection site is irritated and itching CALL ME - if injection site reaction is bigger than the size of your fist, looks infected, blisters, or if you develop hives

## 2024-03-14 NOTE — Progress Notes (Cosign Needed Addendum)
 Pharmacy Note  Subjective:   Patient presents to clinic today to receive first dose of ENBREL  for psoriatic arthritis. She is accompanied by her husband who does administer Mounjaro. She has inadequate response to methotrexate. Rinvoq was denied due to lack of trial/failure of TNF inhibitor  She is nervous to self-inject  Patient running a fever or have signs/symptoms of infection? No  Patient currently on antibiotics for the treatment of infection? No  Patient have any upcoming invasive procedures/surgeries? No  Objective: CMP     Component Value Date/Time   NA 137 02/15/2024 0933   NA 136 08/24/2023 1433   K 4.3 02/15/2024 0933   CL 100 02/15/2024 0933   CO2 27 02/15/2024 0933   GLUCOSE 85 02/15/2024 0933   BUN 11 02/15/2024 0933   BUN 11 08/24/2023 1433   CREATININE 0.70 02/15/2024 0933   CALCIUM 9.7 02/15/2024 0933   PROT 8.0 02/15/2024 0933   PROT 7.4 08/24/2023 1433   ALBUMIN 4.5 08/24/2023 1433   AST 22 02/15/2024 0933   ALT 31 (H) 02/15/2024 0933   ALKPHOS 93 08/24/2023 1433   BILITOT 0.4 02/15/2024 0933   BILITOT 0.3 08/24/2023 1433   GFRNONAA >60 06/02/2020 1652   GFRAA  12/03/2009 1525    >60        The eGFR has been calculated using the MDRD equation. This calculation has not been validated in all clinical situations. eGFR's persistently <60 mL/min signify possible Chronic Kidney Disease.    CBC    Component Value Date/Time   WBC 6.7 02/15/2024 0933   RBC 5.25 (H) 02/15/2024 0933   HGB 14.8 02/15/2024 0933   HGB 13.8 08/24/2023 1433   HCT 45.4 (H) 02/15/2024 0933   HCT 42.4 08/24/2023 1433   PLT 306 02/15/2024 0933   PLT 322 08/24/2023 1433   MCV 86.5 02/15/2024 0933   MCV 88 08/24/2023 1433   MCH 28.2 02/15/2024 0933   MCHC 32.6 02/15/2024 0933   RDW 12.5 02/15/2024 0933   RDW 12.6 08/24/2023 1433   LYMPHSABS 3.1 08/24/2023 1433   MONOABS 1.2 (H) 06/02/2020 1652   EOSABS 67 02/15/2024 0933   EOSABS 0.1 08/24/2023 1433   BASOSABS 80  02/15/2024 0933   BASOSABS 0.1 08/24/2023 1433    Baseline Immunosuppressant Therapy Labs TB GOLD    Latest Ref Rng & Units 02/15/2024    9:33 AM  Quantiferon TB Gold  Quantiferon TB Gold Plus NEGATIVE NEGATIVE    Hepatitis Panel    Latest Ref Rng & Units 02/15/2024    9:33 AM  Hepatitis  Hep B Surface Ag NON-REACTIVE NON-REACTIVE   Hep B IgM NON-REACTIVE NON-REACTIVE   Hep C Ab NON-REACTIVE NON-REACTIVE    HIV No results found for: HIV Immunoglobulins   SPEP    Latest Ref Rng & Units 02/15/2024    9:33 AM  Serum Protein Electrophoresis  Total Protein 6.1 - 8.1 g/dL 8.0    Chest x-ray: 89/09/7982 - Mild right lower lobe infiltrate.   Assessment/Plan:  Counseled patient that Enbrel  is a TNF blocking agent.  Counseled patient on purpose, proper use, and adverse effects of Enbrel .  Reviewed the most common adverse effects including infections, headache, and injection site reactions.  Discussed that there is the possibility of an increased risk of malignancy including non-melanoma skin cancer but it is not well understood if this increased risk is due to the medication or the disease state.  Advised patient to get yearly dermatology exams due to risk  of skin cancer.  Counseled patient that Enbrel  should be held prior to scheduled surgery.  Counseled patient to avoid live vaccines while on Enbrel .  Recommend annual influenza, PCV 15 or PCV20 or Pneumovax 23, and Shingrix as indicated.  Reviewed the importance of regular labs while on Enbrel  therapy.  Will monitor CBC and CMP 1 month after starting and then every 3 months routinely thereafter. Will monitor TB gold annually. Standing orders placed. Provided patient with medication education material and answered all questions.  Patient consented to Enbrel .  Will upload consent into the media tab.  Reviewed storage instructions for Enbrel .    Reviewed importance of holding ENBREL  with signs/symptoms of an infections, if antibiotics are  prescribed to treat an active infection, and with invasive procedures  Demonstrated proper injection technique with Enbrel  demo device  Patient's husband demonstrated proper injection technique using the teach back method.  Patient self injected in the left lower abdomen with:  Pharmacy-supplied Medication: Enbrel  Sureclick 50mg /mL pen NDC: 41593-967-95   Patient tolerated well.  Observed for 30 mins in office for adverse reaction. Patient denies itchiness and irritation at injection., No swelling or redness noted., and Reviewed injection site reaction management with patient verbally and printed information for review in AVS  Patient is to return in 1 month for labs and 6-8 weeks for follow-up appointment.  Standing orders for CBC/CMP placed.  TB gold will be monitored yearly.  Referral to Dermatology placed today for yearly skin checks while on TNF inhibitor due to risk for non melanoma skin cancer  ENBREL  approved through insurance .   Rx sent to: [facility]: [phone] .  Patient provided with pharmacy phone number and advised they will call to schedule shipment to home.  Patient will continue ENBREL  50mg  subcut every 7 days as monotherapy  All questions encouraged and answered.  Instructed patient to call with any further questions or concerns.  Sherry Pennant, PharmD, MPH, BCPS, CPP Clinical Pharmacist (Rheumatology and Pulmonology)  03/14/2024 11:39 AM

## 2024-03-15 ENCOUNTER — Other Ambulatory Visit (HOSPITAL_COMMUNITY): Payer: Self-pay

## 2024-03-15 NOTE — Progress Notes (Signed)
 Patient newly started Enbrel  in office on 03/14/2024. Husband administered at appt but patient feels comfortable in self-administering at home Will continue Enbrel  50mg  subcut every 7 days for PsA  Sherry Pennant, PharmD, MPH, BCPS, CPP Clinical Pharmacist (Rheumatology and Pulmonology)

## 2024-03-21 ENCOUNTER — Other Ambulatory Visit (HOSPITAL_BASED_OUTPATIENT_CLINIC_OR_DEPARTMENT_OTHER): Payer: Self-pay

## 2024-03-22 ENCOUNTER — Other Ambulatory Visit (HOSPITAL_BASED_OUTPATIENT_CLINIC_OR_DEPARTMENT_OTHER): Payer: Self-pay

## 2024-03-25 ENCOUNTER — Other Ambulatory Visit (HOSPITAL_BASED_OUTPATIENT_CLINIC_OR_DEPARTMENT_OTHER): Payer: Self-pay

## 2024-03-25 ENCOUNTER — Other Ambulatory Visit: Payer: Self-pay

## 2024-03-26 ENCOUNTER — Other Ambulatory Visit: Payer: Self-pay

## 2024-03-26 ENCOUNTER — Other Ambulatory Visit (HOSPITAL_BASED_OUTPATIENT_CLINIC_OR_DEPARTMENT_OTHER): Payer: Self-pay

## 2024-03-27 ENCOUNTER — Other Ambulatory Visit: Payer: Self-pay

## 2024-03-27 ENCOUNTER — Other Ambulatory Visit (HOSPITAL_BASED_OUTPATIENT_CLINIC_OR_DEPARTMENT_OTHER): Payer: Self-pay

## 2024-03-28 ENCOUNTER — Other Ambulatory Visit: Payer: Self-pay

## 2024-03-28 ENCOUNTER — Other Ambulatory Visit (HOSPITAL_BASED_OUTPATIENT_CLINIC_OR_DEPARTMENT_OTHER): Payer: Self-pay

## 2024-04-01 ENCOUNTER — Other Ambulatory Visit (HOSPITAL_COMMUNITY): Payer: Self-pay

## 2024-04-01 ENCOUNTER — Other Ambulatory Visit: Payer: Self-pay

## 2024-04-01 ENCOUNTER — Encounter (INDEPENDENT_AMBULATORY_CARE_PROVIDER_SITE_OTHER): Payer: Self-pay

## 2024-04-01 NOTE — Progress Notes (Signed)
 Specialty Pharmacy Refill Coordination Note  Carla Li is a 48 y.o. female contacted today regarding refills of specialty medication(s) Etanercept  (Enbrel  SureClick)   Patient requested (Patient-Rptd) Pickup at [facility] date: (Patient-Rptd) 04/05/24   Medication will be filled on 04/04/24.

## 2024-04-03 ENCOUNTER — Other Ambulatory Visit: Payer: Self-pay

## 2024-04-17 MED FILL — Montelukast Sodium Tab 10 MG (Base Equiv): ORAL | 30 days supply | Qty: 30 | Fill #0 | Status: AC

## 2024-04-18 ENCOUNTER — Other Ambulatory Visit (HOSPITAL_BASED_OUTPATIENT_CLINIC_OR_DEPARTMENT_OTHER): Payer: Self-pay

## 2024-04-24 ENCOUNTER — Other Ambulatory Visit (HOSPITAL_BASED_OUTPATIENT_CLINIC_OR_DEPARTMENT_OTHER): Payer: Self-pay

## 2024-04-26 ENCOUNTER — Encounter (INDEPENDENT_AMBULATORY_CARE_PROVIDER_SITE_OTHER): Payer: Self-pay

## 2024-04-26 ENCOUNTER — Other Ambulatory Visit (HOSPITAL_BASED_OUTPATIENT_CLINIC_OR_DEPARTMENT_OTHER): Payer: Self-pay

## 2024-04-29 ENCOUNTER — Other Ambulatory Visit: Payer: Self-pay

## 2024-04-29 NOTE — Progress Notes (Signed)
 Specialty Pharmacy Refill Coordination Note  Carla Li is a 48 y.o. female contacted today regarding refills of specialty medication(s) Etanercept  (Enbrel  SureClick)   Patient requested Delivery   Delivery date: 05/02/24   Verified address: [address], [place], [place] [address]   Medication will be filled on 05/01/24.

## 2024-04-30 ENCOUNTER — Other Ambulatory Visit: Payer: Self-pay

## 2024-05-02 ENCOUNTER — Other Ambulatory Visit (HOSPITAL_BASED_OUTPATIENT_CLINIC_OR_DEPARTMENT_OTHER): Payer: Self-pay

## 2024-05-02 ENCOUNTER — Encounter: Payer: Self-pay | Admitting: Family Medicine

## 2024-05-02 ENCOUNTER — Ambulatory Visit (INDEPENDENT_AMBULATORY_CARE_PROVIDER_SITE_OTHER): Admitting: Family Medicine

## 2024-05-02 VITALS — BP 121/77 | HR 81 | Temp 98.3°F | Ht 67.5 in | Wt 164.0 lb

## 2024-05-02 DIAGNOSIS — F321 Major depressive disorder, single episode, moderate: Secondary | ICD-10-CM | POA: Diagnosis not present

## 2024-05-02 DIAGNOSIS — L4050 Arthropathic psoriasis, unspecified: Secondary | ICD-10-CM

## 2024-05-02 DIAGNOSIS — F5101 Primary insomnia: Secondary | ICD-10-CM

## 2024-05-02 DIAGNOSIS — G2581 Restless legs syndrome: Secondary | ICD-10-CM | POA: Diagnosis not present

## 2024-05-02 DIAGNOSIS — D508 Other iron deficiency anemias: Secondary | ICD-10-CM

## 2024-05-02 LAB — LIPID PANEL

## 2024-05-02 MED ORDER — CARBIDOPA-LEVODOPA 25-100 MG PO TABS
1.0000 | ORAL_TABLET | Freq: Three times a day (TID) | ORAL | 5 refills | Status: AC
  Filled 2024-05-02 – 2024-05-16 (×2): qty 90, 30d supply, fill #0
  Filled 2024-06-12: qty 90, 30d supply, fill #1
  Filled 2024-07-11: qty 90, 30d supply, fill #2

## 2024-05-02 MED ORDER — QUETIAPINE FUMARATE 25 MG PO TABS
25.0000 mg | ORAL_TABLET | Freq: Every day | ORAL | 1 refills | Status: DC
  Filled 2024-05-02: qty 90, 90d supply, fill #0

## 2024-05-02 NOTE — Progress Notes (Unsigned)
 Subjective:  Patient ID: Carla Li, female    DOB: 07-24-1976  Age: 48 y.o. MRN: [id number]  CC: Medical Management of Chronic Issues   HPI  Discussed the use of AI scribe software for clinical note transcription with the patient, who gave verbal consent to proceed.  History of Present Illness Carla Li is a 48 year old female who presents with irritability and sleep disturbances.  She has experienced increased irritability and poor sleep since starting an estrogen patch prescribed by her OB GYN in April or May. She describes having 'no tolerance for just stupid stuff' and feeling irritable, particularly on the first full night she is home after working. Her sleep is described as 'pathetic,' with difficulty staying asleep, especially on nights when she does not have to wake up for work. She has been awake since 1 AM on the day of the visit.  She was previously prescribed mirtazapine  to help with sleep, but her husband disposed of it after she experienced a severe emotional reaction. The medication helped her sleep but did not alleviate her irritability. She is currently using an estrogen patch, changing it twice a week, on Sundays and Thursdays. She notes that the patch seems to lose effectiveness by Wednesday, leading to increased irritability and sleep issues until she applies a new patch.  She is also experiencing changes in her skin, including increased acne, which she attributes to the estrogen patch. She has been using CeraVe benzoyl peroxide  face wash for her acne. She has a referral to see a dermatologist for further evaluation.  She is currently taking Enbrel  injections weekly for psoriatic arthritis, which she started recently. She also takes cetirizine  as needed for allergies, which are well-controlled. She uses albuterol  as needed for asthma, which has not been problematic recently. She also takes ropinirole  at night for restless legs, which is effective. She takes  carbidopa /levodopa  25/100 mg, one and a half tablets at night, which helps with leg symptoms.  No recent asthma issues and uses albuterol  as needed. She has a nebulizer available but has not used it recently. She does not currently take montelukast .          05/02/2024    1:07 PM 10/19/2023   11:13 AM 09/06/2023    9:21 AM  Depression screen PHQ 2/9  Decreased Interest 1 0 1  Down, Depressed, Hopeless 1 1 0  PHQ - 2 Score 2 1 1   Altered sleeping 2 3 2   Tired, decreased energy 3 3 3   Change in appetite 1 0 1  Feeling bad or failure about yourself  0 0 0  Trouble concentrating 0 3 1  Moving slowly or fidgety/restless 2 0 0  Suicidal thoughts 0 0 0  PHQ-9 Score 10 10 8   Difficult doing work/chores Somewhat difficult Not difficult at all     History Kseniya has a past medical history of Asthma, Diabetes mellitus without complication (HCC), and Rheumatoid arthritis (HCC) (03/2023).   She has a past surgical history that includes Abdominal hysterectomy and tube placed and removed in ears.   Her family history includes Asthma in her sister; Celiac disease in her mother; Diabetes in her sister; Drug abuse in her brother; Eczema in her sister; Hearing loss in her sister; Heart murmur in her daughter; Hypotension in her mother; Seizures in her brother.She reports that she has quit smoking. She has never used smokeless tobacco. She reports that she does not currently use alcohol. She reports that she does not use drugs.  ROS Review of Systems  Constitutional: Negative.   HENT: Negative.    Eyes:  Negative for visual disturbance.  Respiratory:  Negative for shortness of breath.   Cardiovascular:  Negative for chest pain.  Gastrointestinal:  Negative for abdominal pain.  Musculoskeletal:  Negative for arthralgias.    Objective:  BP 121/77   Pulse 81   Temp 98.3 F (36.8 C)   Ht 5' 7.5 (1.715 m)   Wt 164 lb (74.4 kg)   SpO2 97%   BMI 25.31 kg/m   BP Readings from Last 3  Encounters:  05/02/24 121/77  02/15/24 122/83  10/19/23 126/80    Wt Readings from Last 3 Encounters:  05/02/24 164 lb (74.4 kg)  02/15/24 166 lb (75.3 kg)  10/19/23 170 lb (77.1 kg)     Physical Exam Physical Exam GENERAL: Alert, cooperative, well developed, no acute distress. HEENT: Normocephalic, normal oropharynx, moist mucous membranes. CHEST: Clear to auscultation bilaterally, no wheezes, rhonchi, or crackles. CARDIOVASCULAR: Normal heart rate and rhythm, S1 and S2 normal without murmurs. ABDOMEN: Soft, non-tender, non-distended, without organomegaly, normal bowel sounds. EXTREMITIES: No cyanosis or edema. NEUROLOGICAL: Cranial nerves grossly intact, moves all extremities without gross motor or sensory deficit.   Assessment & Plan:  Psoriatic arthritis (HCC) -     CBC with Differential/Platelet -     CMP14+EGFR -     Lipid panel  Other iron deficiency anemia -     CBC with Differential/Platelet -     CMP14+EGFR -     Lipid panel  Restless legs syndrome -     CBC with Differential/Platelet -     CMP14+EGFR -     Lipid panel  Current moderate episode of major depressive disorder, unspecified whether recurrent (HCC)  Primary insomnia  Other orders -     QUEtiapine  Fumarate; Take 1 tablet (25 mg total) by mouth at bedtime.  Dispense: 90 tablet; Refill: 1 -     Carbidopa -Levodopa ; Take 1 tablet by mouth 3 (three) times daily.  Dispense: 90 tablet; Refill: 5    Assessment and Plan Assessment & Plan Depression with insomnia and irritability   She reports increased irritability and poor sleep, especially on nights off work. The estrogen patch may contribute to these symptoms, and mirtazapine  was discontinued due to adverse effects. The patch may not last its full duration, so she should discuss this with her gynecologist. Quetiapine  is considered for mood stabilization and sleep. Prescribe quetiapine  25 mg for mood stabilization and sleep, to be used if needed.  Advise discussing the estrogen patch regimen with her gynecologist and consider changing the patch on Wednesday evening instead of Thursday to prevent irritability and sleep issues. Educate on potential side effects of quetiapine , emphasizing that the prescribed dose is much lower than doses used for schizophrenia.  Psoriatic arthritis   She is currently on Enbrel  injections and reports increased acne, possibly related to estrogen therapy or Enbrel . Continue Enbrel  injections as prescribed. Advise following up with a dermatologist for acne management, as it may be related to current treatments.  Acne   She reports increased acne, possibly related to estrogen therapy or other medications. She is using CeraVe benzoyl peroxide  face wash and considering other treatments. Advise using Neutrogena salicylic acid face wash and benzoyl peroxide  cream at bedtime. Refer to a dermatologist for further evaluation and management of acne.  Restless legs syndrome   Her condition is well-controlled with ropinirole  at bedtime, and carbidopa -levodopa  helps alleviate symptoms during the day. Continue ropinirole   at bedtime. Continue carbidopa -levodopa , adjusting the prescription to allow for 90 tablets per month to ensure an adequate supply.  Asthma   She has no current asthma issues and minimal allergy symptoms. She uses albuterol  as needed and finds cetirizine  effective for allergy management. Continue albuterol  inhaler and nebulizer as needed. Continue cetirizine  for allergy management. Discontinue levalbuterol  as it is not currently needed.       Follow-up: Return in about 6 months (around 10/30/2024).  Butler Der, M.D.

## 2024-05-03 ENCOUNTER — Ambulatory Visit: Payer: Self-pay | Admitting: Family Medicine

## 2024-05-03 ENCOUNTER — Encounter: Payer: Self-pay | Admitting: Family Medicine

## 2024-05-03 LAB — LIPID PANEL
Cholesterol, Total: 189 mg/dL (ref 100–199)
HDL: 63 mg/dL (ref >39)
LDL CALC COMMENT:: 3 ratio (ref 0.0–4.4)
LDL Chol Calc (NIH): 115 mg/dL — AB (ref 0–99)
Triglycerides: 59 mg/dL (ref 0–149)
VLDL Cholesterol Cal: 11 mg/dL (ref 5–40)

## 2024-05-03 LAB — CMP14+EGFR
ALT: 20 IU/L (ref 0–32)
AST: 18 IU/L (ref 0–40)
Albumin: 4.8 g/dL (ref 3.9–4.9)
Alkaline Phosphatase: 85 IU/L (ref 41–116)
BUN/Creatinine Ratio: 13 (ref 9–23)
BUN: 10 mg/dL (ref 6–24)
Bilirubin Total: 0.7 mg/dL (ref 0.0–1.2)
CO2: 22 mmol/L (ref 20–29)
Calcium: 9.7 mg/dL (ref 8.7–10.2)
Chloride: 100 mmol/L (ref 96–106)
Creatinine, Ser: 0.76 mg/dL (ref 0.57–1.00)
Globulin, Total: 2.9 g/dL (ref 1.5–4.5)
Glucose: 85 mg/dL (ref 70–99)
Potassium: 4.3 mmol/L (ref 3.5–5.2)
Sodium: 137 mmol/L (ref 134–144)
Total Protein: 7.7 g/dL (ref 6.0–8.5)
eGFR: 98 mL/min/1.73 (ref >59)

## 2024-05-03 LAB — CBC WITH DIFFERENTIAL/PLATELET
Basophils Absolute: 0.1 x10E3/uL (ref 0.0–0.2)
Basos: 1 %
EOS (ABSOLUTE): 0 x10E3/uL (ref 0.0–0.4)
Eos: 0 %
Hematocrit: 43.7 % (ref 34.0–46.6)
Hemoglobin: 14.4 g/dL (ref 11.1–15.9)
Immature Grans (Abs): 0 x10E3/uL (ref 0.0–0.1)
Immature Granulocytes: 0 %
Lymphocytes Absolute: 2.8 x10E3/uL (ref 0.7–3.1)
Lymphs: 29 %
MCH: 29.1 pg (ref 26.6–33.0)
MCHC: 33 g/dL (ref 31.5–35.7)
MCV: 89 fL (ref 79–97)
Monocytes Absolute: 1 x10E3/uL — ABNORMAL HIGH (ref 0.1–0.9)
Monocytes: 10 %
Neutrophils Absolute: 5.8 x10E3/uL (ref 1.4–7.0)
Neutrophils: 60 %
Platelets: 269 x10E3/uL (ref 150–450)
RBC: 4.94 x10E6/uL (ref 3.77–5.28)
RDW: 13 % (ref 11.7–15.4)
WBC: 9.7 x10E3/uL (ref 3.4–10.8)

## 2024-05-03 NOTE — Progress Notes (Signed)
Hello [name],  Your lab result is normal and/or stable.Some minor variations that are not significant are commonly marked abnormal, but do not represent any medical problem for you.  Best regards, Claretta Fraise, M.D.

## 2024-05-08 ENCOUNTER — Other Ambulatory Visit (HOSPITAL_BASED_OUTPATIENT_CLINIC_OR_DEPARTMENT_OTHER): Payer: Self-pay

## 2024-05-08 DIAGNOSIS — L718 Other rosacea: Secondary | ICD-10-CM | POA: Diagnosis not present

## 2024-05-08 DIAGNOSIS — Z1283 Encounter for screening for malignant neoplasm of skin: Secondary | ICD-10-CM | POA: Diagnosis not present

## 2024-05-08 DIAGNOSIS — L400 Psoriasis vulgaris: Secondary | ICD-10-CM | POA: Diagnosis not present

## 2024-05-08 DIAGNOSIS — D1801 Hemangioma of skin and subcutaneous tissue: Secondary | ICD-10-CM | POA: Diagnosis not present

## 2024-05-08 DIAGNOSIS — L821 Other seborrheic keratosis: Secondary | ICD-10-CM | POA: Diagnosis not present

## 2024-05-08 DIAGNOSIS — D2371 Other benign neoplasm of skin of right lower limb, including hip: Secondary | ICD-10-CM | POA: Diagnosis not present

## 2024-05-08 DIAGNOSIS — L814 Other melanin hyperpigmentation: Secondary | ICD-10-CM | POA: Diagnosis not present

## 2024-05-08 MED ORDER — IVERMECTIN 1 % EX CREA
TOPICAL_CREAM | CUTANEOUS | 2 refills | Status: AC
  Filled 2024-05-08: qty 45, 90d supply, fill #0
  Filled 2024-07-23 – 2024-07-28 (×2): qty 45, 90d supply, fill #1

## 2024-05-13 ENCOUNTER — Encounter (HOSPITAL_BASED_OUTPATIENT_CLINIC_OR_DEPARTMENT_OTHER): Payer: Self-pay

## 2024-05-13 ENCOUNTER — Other Ambulatory Visit: Payer: Self-pay

## 2024-05-13 ENCOUNTER — Other Ambulatory Visit (HOSPITAL_BASED_OUTPATIENT_CLINIC_OR_DEPARTMENT_OTHER): Payer: Self-pay

## 2024-05-14 ENCOUNTER — Other Ambulatory Visit (HOSPITAL_COMMUNITY): Payer: Self-pay

## 2024-05-14 ENCOUNTER — Other Ambulatory Visit (HOSPITAL_BASED_OUTPATIENT_CLINIC_OR_DEPARTMENT_OTHER): Payer: Self-pay

## 2024-05-16 ENCOUNTER — Other Ambulatory Visit (HOSPITAL_BASED_OUTPATIENT_CLINIC_OR_DEPARTMENT_OTHER): Payer: Self-pay

## 2024-05-16 ENCOUNTER — Other Ambulatory Visit: Payer: Self-pay

## 2024-05-24 ENCOUNTER — Other Ambulatory Visit: Payer: Self-pay | Admitting: Internal Medicine

## 2024-05-24 ENCOUNTER — Other Ambulatory Visit: Payer: Self-pay

## 2024-05-24 ENCOUNTER — Other Ambulatory Visit (HOSPITAL_COMMUNITY): Payer: Self-pay

## 2024-05-24 DIAGNOSIS — L4050 Arthropathic psoriasis, unspecified: Secondary | ICD-10-CM

## 2024-05-24 DIAGNOSIS — Z79899 Other long term (current) drug therapy: Secondary | ICD-10-CM

## 2024-05-24 NOTE — Telephone Encounter (Signed)
 Last Fill: 03/14/2024  Labs 05/02/2024 Monocytes Absolute 1.0,  TB Gold: 02/15/2024   Next Visit: 05/31/2024  Last Visit: 02/15/2024  IK:Ednmpjupr arthritis   Current Dose per office note 03/14/2024: ENBREL  50mg  subcut every 7 days   Okay to refill Enbrel ?

## 2024-05-24 NOTE — Progress Notes (Signed)
 Specialty Pharmacy Refill Coordination Note  Spoke with Carla Li  Carla Li is a 48 y.o. female contacted today regarding refills of specialty medication(s) Etanercept  (Enbrel  SureClick)  Doses on hand: 1 for 10/21  Injection date: 06/04/24   Patient requested: Delivery   Delivery date: 05/29/24   Verified address: [address] [place] [address]  Medication will be filled on 05/28/24.  This fill date is pending response to refill request from provider. Patient is aware and if they have not received fill by intended date, they must follow up with pharmacy.

## 2024-05-28 ENCOUNTER — Other Ambulatory Visit (HOSPITAL_COMMUNITY): Payer: Self-pay

## 2024-05-28 MED FILL — Montelukast Sodium Tab 10 MG (Base Equiv): ORAL | 30 days supply | Qty: 30 | Fill #1 | Status: CN

## 2024-05-30 ENCOUNTER — Other Ambulatory Visit: Payer: Self-pay

## 2024-05-30 ENCOUNTER — Other Ambulatory Visit (HOSPITAL_BASED_OUTPATIENT_CLINIC_OR_DEPARTMENT_OTHER): Payer: Self-pay

## 2024-05-30 ENCOUNTER — Other Ambulatory Visit (HOSPITAL_COMMUNITY): Payer: Self-pay

## 2024-05-30 MED FILL — Montelukast Sodium Tab 10 MG (Base Equiv): ORAL | 30 days supply | Qty: 30 | Fill #0 | Status: AC

## 2024-05-31 ENCOUNTER — Ambulatory Visit: Admitting: Internal Medicine

## 2024-06-04 ENCOUNTER — Other Ambulatory Visit: Payer: Self-pay

## 2024-06-04 MED ORDER — ENBREL SURECLICK 50 MG/ML ~~LOC~~ SOAJ
50.0000 mg | SUBCUTANEOUS | 2 refills | Status: DC
  Filled 2024-06-05: qty 4, 28d supply, fill #0
  Filled 2024-06-27: qty 4, 28d supply, fill #1
  Filled 2024-07-24: qty 4, 28d supply, fill #2

## 2024-06-05 ENCOUNTER — Other Ambulatory Visit (HOSPITAL_COMMUNITY): Payer: Self-pay

## 2024-06-05 ENCOUNTER — Other Ambulatory Visit: Payer: Self-pay

## 2024-06-05 NOTE — Progress Notes (Signed)
 Patient's injection was due on 06/04/2024. A new prescription was received on 06/05/2024. Medication will be shipped on 10/29 for delivery on 06/06/2024. Patient should administer the injection upon receipt of this supply. A voicemail was left for the patient with the updated delivery date.

## 2024-06-06 ENCOUNTER — Other Ambulatory Visit (HOSPITAL_BASED_OUTPATIENT_CLINIC_OR_DEPARTMENT_OTHER): Payer: Self-pay

## 2024-06-06 MED ORDER — FLUZONE 0.5 ML IM SUSY
0.5000 mL | PREFILLED_SYRINGE | Freq: Once | INTRAMUSCULAR | 0 refills | Status: AC
  Filled 2024-06-06: qty 0.5, 1d supply, fill #0

## 2024-06-20 ENCOUNTER — Other Ambulatory Visit: Payer: Self-pay | Admitting: *Deleted

## 2024-06-20 ENCOUNTER — Other Ambulatory Visit (HOSPITAL_BASED_OUTPATIENT_CLINIC_OR_DEPARTMENT_OTHER): Payer: Self-pay

## 2024-06-20 MED ORDER — ROPINIROLE HCL ER 2 MG PO TB24
2.0000 mg | ORAL_TABLET | Freq: Every day | ORAL | 1 refills | Status: AC
  Filled 2024-06-20: qty 90, 90d supply, fill #0

## 2024-06-21 ENCOUNTER — Other Ambulatory Visit: Payer: Self-pay

## 2024-06-21 ENCOUNTER — Other Ambulatory Visit (HOSPITAL_COMMUNITY): Payer: Self-pay

## 2024-06-27 ENCOUNTER — Telehealth: Admitting: Physician Assistant

## 2024-06-27 ENCOUNTER — Other Ambulatory Visit (HOSPITAL_COMMUNITY): Payer: Self-pay

## 2024-06-27 ENCOUNTER — Other Ambulatory Visit (HOSPITAL_BASED_OUTPATIENT_CLINIC_OR_DEPARTMENT_OTHER): Payer: Self-pay

## 2024-06-27 DIAGNOSIS — J020 Streptococcal pharyngitis: Secondary | ICD-10-CM | POA: Diagnosis not present

## 2024-06-27 MED ORDER — AZITHROMYCIN 250 MG PO TABS
ORAL_TABLET | ORAL | 0 refills | Status: AC
  Filled 2024-06-27: qty 6, 5d supply, fill #0

## 2024-06-27 MED FILL — Montelukast Sodium Tab 10 MG (Base Equiv): ORAL | 30 days supply | Qty: 30 | Fill #1 | Status: AC

## 2024-06-27 NOTE — Progress Notes (Signed)

## 2024-07-02 ENCOUNTER — Other Ambulatory Visit (HOSPITAL_COMMUNITY): Payer: Self-pay

## 2024-07-02 ENCOUNTER — Other Ambulatory Visit: Payer: Self-pay

## 2024-07-02 NOTE — Progress Notes (Signed)
 Specialty Pharmacy Refill Coordination Note  Spoke with Hatsue Sime  Majesta Leichter is a 48 y.o. female contacted today regarding refills of specialty medication(s) Etanercept  (Enbrel  SureClick)  Doses on hand: 1 for 11/25  Injection date: 07/09/24   Patient requested: Delivery   Delivery date: 07/03/24   Verified address: [address] [place] [address]  Medication will be filled on 07/02/24

## 2024-07-10 ENCOUNTER — Other Ambulatory Visit: Payer: Self-pay | Admitting: Family Medicine

## 2024-07-11 MED ORDER — MONTELUKAST SODIUM 10 MG PO TABS
10.0000 mg | ORAL_TABLET | Freq: Every day | ORAL | 5 refills | Status: AC
  Filled 2024-07-11 – 2024-07-21 (×2): qty 30, 30d supply, fill #0
  Filled 2024-07-23: qty 30, 30d supply, fill #1
  Filled 2024-08-30: qty 90, 90d supply, fill #1

## 2024-07-12 ENCOUNTER — Other Ambulatory Visit (HOSPITAL_COMMUNITY): Payer: Self-pay

## 2024-07-21 ENCOUNTER — Other Ambulatory Visit (HOSPITAL_COMMUNITY): Payer: Self-pay

## 2024-07-23 ENCOUNTER — Other Ambulatory Visit: Payer: Self-pay

## 2024-07-24 ENCOUNTER — Other Ambulatory Visit: Payer: Self-pay | Admitting: Pharmacy Technician

## 2024-07-24 ENCOUNTER — Other Ambulatory Visit: Payer: Self-pay

## 2024-07-24 NOTE — Progress Notes (Signed)
 Specialty Pharmacy Refill Coordination Note  Carla Li is a 48 y.o. female contacted today regarding refills of specialty medication(s) Etanercept  (Enbrel  SureClick)   Patient requested (Patient-Rptd) Delivery   Delivery date: 08/06/24   Verified address: (Patient-Rptd) [address] [place], [place] [address]   Medication will be filled on: 08/05/24

## 2024-07-25 ENCOUNTER — Other Ambulatory Visit: Payer: Self-pay | Admitting: Pharmacist

## 2024-07-25 ENCOUNTER — Other Ambulatory Visit: Payer: Self-pay

## 2024-07-25 ENCOUNTER — Other Ambulatory Visit (HOSPITAL_COMMUNITY): Payer: Self-pay

## 2024-07-25 NOTE — Progress Notes (Signed)
 Clinical Intervention Note  Clinical Intervention Notes: Patient reported that she has started taking Omega 3 fish oil, vitamin D3 and a multivitamin. No DDI's found with Enbrel .   Clinical Intervention Outcomes: Prevention of an adverse drug event   Lyle LELON Chalk Specialty Pharmacist

## 2024-07-29 ENCOUNTER — Other Ambulatory Visit (HOSPITAL_COMMUNITY): Payer: Self-pay

## 2024-08-05 ENCOUNTER — Other Ambulatory Visit: Payer: Self-pay

## 2024-08-10 ENCOUNTER — Other Ambulatory Visit: Payer: Self-pay | Admitting: Family Medicine

## 2024-08-10 ENCOUNTER — Other Ambulatory Visit (HOSPITAL_COMMUNITY): Payer: Self-pay

## 2024-08-12 ENCOUNTER — Encounter: Payer: Self-pay | Admitting: Family Medicine

## 2024-08-12 ENCOUNTER — Other Ambulatory Visit: Payer: Self-pay

## 2024-08-12 MED ORDER — FUROSEMIDE 20 MG PO TABS
20.0000 mg | ORAL_TABLET | Freq: Every day | ORAL | 0 refills | Status: AC
  Filled 2024-08-12: qty 90, 90d supply, fill #0

## 2024-08-12 NOTE — Telephone Encounter (Signed)
 LMTCB to schedule appt Letter mailed

## 2024-08-12 NOTE — Telephone Encounter (Signed)
 Stacks NTBS in March for 6 mos FU RF sent to pharmacy

## 2024-08-20 NOTE — Progress Notes (Signed)
 "  Office Visit Note  Patient: Carla Li             Date of Birth: [birth year]           MRN: [id number]             PCP: Zollie Lowers, MD Referring: Zollie Lowers, MD Visit Date: 08/21/2024   Subjective:  Rash    Discussed the use of AI scribe software for clinical note transcription with the patient, who gave verbal consent to proceed.  History of Present Illness   Carla Li is a 49 year old female with psoriatic arthritis who presents for follow-up on Enbrel  50 mg [place] weekly.  She has been on Enbrel  injections for her psoriatic arthritis, which was a change of plan from the previously discussed [place]. The treatment has been beneficial, with more good days than bad. However, she noticed significant hand swelling, particularly in the mornings, which her sister pointed out during a visit. This swelling has improved with dietary changes, including increased water intake and consumption of fruits, vegetables, and lean meats. Despite the improvement, she still experiences morning stiffness, especially affecting her hands, which impacts her work that involves typing and mouse use.  She experiences aches and tenderness in her arms, describing the area as 'really tender and really soft.' She occasionally takes ibuprofen 800 mg, originally prescribed for back pain, about once every three days to manage morning stiffness and tightness in her lungs and hands.  Regarding her skin condition, she has experienced flare-ups of a rash, particularly in her ears, which worsened after switching back to old hearing aids. She has not used any topical steroids but has been using ivermectin  cream on her face as prescribed by a dermatologist. She switched her skincare products to [facility] with Leretha Claw, which has helped with dry patches.  No significant viral illnesses since starting Enbrel , but she mentions occasional days of extreme fatigue where she feels 'really worn out, really tired.' These episodes  resolve with rest and sleep.      Previous HPI 02/15/24 Carla Li is a 49 year old female with psoriasis who presents for evaluation of persistent skin and joint symptoms. She was referred by her family doctor for evaluation of psoriasis and suspected psoriatic arthritis.   She has a history of psoriasis, initially misdiagnosed as eczema, which began two summers ago with a rash on her chest, buttocks, and ankles. The rash was oozing, causing her clothes to stick to it, and persisted despite treatment with desonide  cream. Changes in laundry, soap, and lotion helped reduce the rash over the summer. She reports that her ENT suggested her persistent ear drainage may be related to an allergy to her hearing aid molds.   She experiences joint pain, particularly in her hands, which is most severe in the mornings and after activities such as gardening and cooking. Her fingernails are described as 'paper thin', and there is swelling in her hands and feet, especially after standing for long periods at work. She takes ibuprofen 800 mg to manage the pain, which is located in her hands, under her ribs, and sometimes in her feet and ankles.   She tried methotrexate for three months starting in December but discontinued it due to increased body pain. Her family doctor prescribed ibuprofen, which provides some relief.   She has a history of childhood asthma and takes medication for it. She recently discovered her biological father's identity but is unaware of any family history of autoimmune  diseases.   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 She has a history of waitressing for twelve years, which may have contributed to her foot pain.    Review of Systems  Constitutional:  Positive for fatigue.  HENT:  Negative for mouth sores and mouth dryness.   Eyes:  Positive for itching. Negative for dryness.  Respiratory:  Negative for  shortness of breath.   Cardiovascular:  Negative for chest pain and palpitations.  Gastrointestinal:  Positive for constipation. Negative for blood in stool and diarrhea.  Endocrine: Negative for increased urination.  Genitourinary:  Negative for involuntary urination.  Musculoskeletal:  Positive for morning stiffness and muscle tenderness. Negative for joint pain, gait problem, joint pain, joint swelling, myalgias, muscle weakness and myalgias.  Skin:  Positive for rash. Negative for color change, hair loss and sensitivity to sunlight.  Allergic/Immunologic: Negative for susceptible to infections.  Neurological:  Positive for headaches. Negative for dizziness.  Hematological:  Negative for swollen glands.  Psychiatric/Behavioral:  Positive for sleep disturbance. Negative for depressed mood. The patient is nervous/anxious.     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   Chronic eczematous otitis externa of both ears 02/21/2018   Eustachian tube dysfunction, bilateral 02/21/2018   Adenomyosis 11/01/2016   Restless legs syndrome 11/26/2015   Asthma 01/29/2015   Other allergic rhinitis 01/29/2015   Otitis externa 01/29/2015   HYPOKALEMIA 07/30/2009   ANEMIA-IRON DEFICIENCY 07/30/2009   DIABETES MELLITUS, BORDERLINE 07/30/2009    Past Medical History:  Diagnosis Date   Asthma    Diabetes mellitus without complication (HCC)    Rheumatoid arthritis (HCC) 03/2023    Family History  Problem Relation Age of Onset   Celiac disease Mother    Hypotension Mother    Asthma Sister    Diabetes Sister    Hearing loss Sister    Eczema Sister    Heart murmur Daughter    Drug abuse Brother    Seizures Brother    Past Surgical History:  Procedure Laterality Date   ABDOMINAL HYSTERECTOMY     tube placed  and removed in ears     Social History   Social History Narrative   Not on file   Immunization History  Administered Date(s) Administered   Influenza, Seasonal, Injecte, Preservative Fre 05/19/2023, 06/06/2024   Influenza,inj,Quad PF,6+ Mos 05/19/2022   Pneumococcal Polysaccharide-23 07/12/2016   Tdap 12/24/2015     Objective: Vital Signs: BP 126/87   Pulse 80   Temp 98 F (36.7 C)   Resp 16   Ht 5' 7.5 (1.715 m)   Wt 166 lb 6.4 oz (75.5 kg)   BMI 25.68 kg/m    Physical Exam Eyes:     Conjunctiva/sclera: Conjunctivae normal.  Cardiovascular:     Rate and Rhythm: Normal rate and regular rhythm.  Pulmonary:     Effort: Pulmonary effort is normal.     Breath sounds: Normal breath sounds.  Lymphadenopathy:     Cervical: No cervical adenopathy.  Skin:    General: Skin is warm and dry.     Comments: Erythematous rash behind ears, none visible in external ear Nails thin with filing/sanding changes  Neurological:     Mental Status: She is alert.  Psychiatric:  Mood and Affect: Mood normal.      Musculoskeletal Exam:  Shoulders full ROM no tenderness or swelling Elbows full ROM no tenderness or swelling Wrists full ROM, no tenderness or swelling Fingers full ROM no tenderness or swelling Low back midline and bilateral paraspinal muscle tenderness above iliac crest, no radiation Hip normal internal and external rotation without pain, no tenderness to lateral hip palpation Knees full ROM no tenderness or swelling Ankles full ROM no tenderness or swelling  Investigation: No additional findings.  Imaging: No results found.  Recent Labs: Lab Results  Component Value Date   WBC 9.7 05/02/2024   HGB 14.4 05/02/2024   PLT 269 05/02/2024   NA 137 05/02/2024   K 4.3 05/02/2024   CL 100 05/02/2024   CO2 22 05/02/2024   GLUCOSE 85 05/02/2024   BUN 10 05/02/2024   CREATININE 0.76 05/02/2024   BILITOT 0.7 05/02/2024   ALKPHOS 85 05/02/2024   AST 18  05/02/2024   ALT 20 05/02/2024   PROT 7.7 05/02/2024   ALBUMIN 4.8 05/02/2024   CALCIUM 9.7 05/02/2024   GFRAA  12/03/2009    >60        The eGFR has been calculated using the MDRD equation. This calculation has not been validated in all clinical situations. eGFR's persistently <60 mL/min signify possible Chronic Kidney Disease.   QFTBGOLDPLUS NEGATIVE 02/15/2024    Speciality Comments: No specialty comments available.  Procedures:  No procedures performed Allergies: Benadryl [diphenhydramine], Sulfa antibiotics, Bactrim [sulfamethoxazole-trimethoprim], Codeine, Codeine, Penicillins, Pineapple, Sulfonamide derivatives, and Wound dressing adhesive   Assessment / Plan:     Visit Diagnoses:  Assessment & Plan Psoriatic arthritis (HCC) Managed with Enbrel , resulting in improved symptoms. Increased photosensitivity noted. Minimal changes consistent with early osteoarthritis. Stiffness and soreness improved with ibuprofen. - Continue ibuprofen 800 mg as needed for morning stiffness. - Encouraged exercises to improve hand mobility and reduce stiffness.  - Continue Enbrel  injections 50 mg weekly. - Encouraged dietary modifications to reduce swelling.    High risk medication use Discussed risks for UV exposure given history os using low dose tanning bed treatments in spring for reducing summer time burning frequency. I believe her risk for this is low in the described pattern of use, but general precaution with long term TNF inhibitor and very fair skin type. - Advised cautious UV light exposure, monitoring for increased skin sensitivity. - Recheck blood tests at next annual checkup in February.    Other psoriasis Fluctuating rash. No significant nail changes. - Continue ivermectin  cream on the face. - Encouraged cautious UV light exposure, monitoring for increased skin sensitivity.    Chronic bilateral low back pain without sciatica Low back pain likely muscular due to  scoliosis-related imbalance. Does not sound typical for inflammatory back pain. - Recommended core strengthening exercises such as Pilates, Tai Chi, or yoga to improve spinal alignment and reduce muscular imbalance.       Follow-Up Instructions: Return in about 6 months (around 02/18/2025) for PsA on ENB f/u 6mos.   Lonni LELON Ester, MD  Note - This record has been created using [employer].  Chart creation errors have been sought, but may not always  have been located. Such creation errors do not reflect on  the standard of medical care. "

## 2024-08-21 ENCOUNTER — Encounter: Payer: Self-pay | Admitting: Internal Medicine

## 2024-08-21 ENCOUNTER — Ambulatory Visit: Attending: Internal Medicine | Admitting: Internal Medicine

## 2024-08-21 VITALS — BP 126/87 | HR 80 | Temp 98.0°F | Resp 16 | Ht 67.5 in | Wt 166.4 lb

## 2024-08-21 DIAGNOSIS — M545 Low back pain, unspecified: Secondary | ICD-10-CM | POA: Diagnosis not present

## 2024-08-21 DIAGNOSIS — G8929 Other chronic pain: Secondary | ICD-10-CM

## 2024-08-21 DIAGNOSIS — L4050 Arthropathic psoriasis, unspecified: Secondary | ICD-10-CM

## 2024-08-21 DIAGNOSIS — L408 Other psoriasis: Secondary | ICD-10-CM | POA: Diagnosis not present

## 2024-08-21 DIAGNOSIS — Z79899 Other long term (current) drug therapy: Secondary | ICD-10-CM | POA: Diagnosis not present

## 2024-08-23 ENCOUNTER — Other Ambulatory Visit: Payer: Self-pay | Admitting: Internal Medicine

## 2024-08-23 ENCOUNTER — Other Ambulatory Visit: Payer: Self-pay

## 2024-08-23 DIAGNOSIS — L4050 Arthropathic psoriasis, unspecified: Secondary | ICD-10-CM

## 2024-08-23 DIAGNOSIS — Z79899 Other long term (current) drug therapy: Secondary | ICD-10-CM

## 2024-08-23 NOTE — Telephone Encounter (Signed)
 Last Fill: 06/04/2024  Labs: 05/02/2024 monocytes absolute 1.0  TB Gold: 02/15/2024 negative    Next Visit: 02/18/2025  Last Visit: 08/21/2024  IK:[id number]   Current Dose per office note on 08/21/2024: dose not mentioned.   Okay to refill Enbrel ?

## 2024-08-27 ENCOUNTER — Other Ambulatory Visit: Payer: Self-pay | Admitting: Internal Medicine

## 2024-08-27 ENCOUNTER — Other Ambulatory Visit (HOSPITAL_COMMUNITY): Payer: Self-pay

## 2024-08-27 ENCOUNTER — Other Ambulatory Visit: Payer: Self-pay

## 2024-08-27 DIAGNOSIS — Z79899 Other long term (current) drug therapy: Secondary | ICD-10-CM

## 2024-08-27 DIAGNOSIS — L4050 Arthropathic psoriasis, unspecified: Secondary | ICD-10-CM

## 2024-08-27 MED ORDER — ENBREL SURECLICK 50 MG/ML ~~LOC~~ SOAJ
50.0000 mg | SUBCUTANEOUS | 2 refills | Status: AC
  Filled 2024-08-27 – 2024-08-28 (×2): qty 4, 28d supply, fill #0

## 2024-08-27 NOTE — Telephone Encounter (Signed)
 Last Fill: 06/04/2024  Labs: 05/02/2024 Monocytes Absolute 1.0  TB Gold: 02/15/2024 Neg    Next Visit: 02/18/2025  Last Visit: 08/21/2024  DX:not discussed  Current Dose per office note 08/21/2024: not discussed  Patient advised she will be having her annual physical on 25/2026 and will update labs with PCP.  Okay to refill Enbrel ?

## 2024-08-28 ENCOUNTER — Other Ambulatory Visit: Payer: Self-pay

## 2024-08-29 ENCOUNTER — Other Ambulatory Visit: Payer: Self-pay

## 2024-08-30 ENCOUNTER — Other Ambulatory Visit (HOSPITAL_COMMUNITY): Payer: Self-pay

## 2024-08-30 ENCOUNTER — Other Ambulatory Visit: Payer: Self-pay

## 2024-08-30 NOTE — Progress Notes (Signed)
 Specialty Pharmacy Refill Coordination Note  Carla Li is a 49 y.o. female contacted today regarding refills of specialty medication(s) Etanercept  (Enbrel  SureClick)   Patient requested (Patient-Rptd) Delivery   Delivery date: 09/05/24   Verified address: (Patient-Rptd) [address] [place], [place] [address]   Medication will be filled on: 09/04/24

## 2024-09-04 ENCOUNTER — Other Ambulatory Visit: Payer: Self-pay

## 2024-09-06 DIAGNOSIS — L408 Other psoriasis: Secondary | ICD-10-CM | POA: Insufficient documentation

## 2024-09-06 DIAGNOSIS — M545 Low back pain, unspecified: Secondary | ICD-10-CM | POA: Insufficient documentation

## 2024-09-06 NOTE — Assessment & Plan Note (Signed)
 Discussed risks for UV exposure given history os using low dose tanning bed treatments in spring for reducing summer time burning frequency. I believe her risk for this is low in the described pattern of use, but general precaution with long term TNF inhibitor and very fair skin type. - Advised cautious UV light exposure, monitoring for increased skin sensitivity. - Recheck blood tests at next annual checkup in February.

## 2024-09-06 NOTE — Assessment & Plan Note (Signed)
 Fluctuating rash. No significant nail changes. - Continue ivermectin  cream on the face. - Encouraged cautious UV light exposure, monitoring for increased skin sensitivity.

## 2024-09-06 NOTE — Assessment & Plan Note (Signed)
 Low back pain likely muscular due to scoliosis-related imbalance. Does not sound typical for inflammatory back pain. - Recommended core strengthening exercises such as Pilates, Tai Chi, or yoga to improve spinal alignment and reduce muscular imbalance.

## 2024-09-12 ENCOUNTER — Encounter: Admitting: Family Medicine

## 2024-09-12 ENCOUNTER — Telehealth: Payer: Self-pay

## 2024-09-12 NOTE — Telephone Encounter (Signed)
 Left message to call back to see about rescheduling missed physical appointment. Dr. Zollie ok'd to be scheduled on 2/18 if patient is available.

## 2025-02-18 ENCOUNTER — Ambulatory Visit: Admitting: Internal Medicine
